# Patient Record
Sex: Male | Born: 1935 | Race: White | Hispanic: No | Marital: Married | Smoking: Former smoker
Health system: Southern US, Community
[De-identification: ages and names within clinical notes are randomized; demographics above are authoritative.]

## PROBLEM LIST (undated history)

## (undated) DIAGNOSIS — R7889 Finding of other specified substances, not normally found in blood: Secondary | ICD-10-CM

## (undated) DIAGNOSIS — R011 Cardiac murmur, unspecified: Secondary | ICD-10-CM

## (undated) DIAGNOSIS — I739 Peripheral vascular disease, unspecified: Secondary | ICD-10-CM

## (undated) DIAGNOSIS — I2510 Atherosclerotic heart disease of native coronary artery without angina pectoris: Secondary | ICD-10-CM

## (undated) DIAGNOSIS — R42 Dizziness and giddiness: Secondary | ICD-10-CM

## (undated) DIAGNOSIS — I10 Essential (primary) hypertension: Secondary | ICD-10-CM

## (undated) DIAGNOSIS — E119 Type 2 diabetes mellitus without complications: Secondary | ICD-10-CM

## (undated) DIAGNOSIS — N289 Disorder of kidney and ureter, unspecified: Secondary | ICD-10-CM

## (undated) HISTORY — DX: Essential (primary) hypertension: I10

## (undated) HISTORY — DX: Atherosclerotic heart disease of native coronary artery without angina pectoris: I25.10

## (undated) HISTORY — DX: Dizziness and giddiness: R42

## (undated) HISTORY — DX: Type 2 diabetes mellitus without complications: E11.9

## (undated) HISTORY — DX: Peripheral vascular disease, unspecified: I73.9

## (undated) HISTORY — PX: CAROTID ENDARTERECTOMY: SUR193

## (undated) HISTORY — DX: Finding of other specified substances, not normally found in blood: R78.89

## (undated) HISTORY — PX: CARDIAC PACEMAKER PLACEMENT: SHX583

## (undated) HISTORY — DX: Disorder of kidney and ureter, unspecified: N28.9

## (undated) HISTORY — DX: Cardiac murmur, unspecified: R01.1

## (undated) HISTORY — PX: HERNIA REPAIR: SHX51

---

## 2004-09-18 ENCOUNTER — Ambulatory Visit (HOSPITAL_COMMUNITY): Admission: RE | Admit: 2004-09-18 | Discharge: 2004-09-18 | Payer: Self-pay | Admitting: Cardiology

## 2005-11-04 ENCOUNTER — Ambulatory Visit (HOSPITAL_COMMUNITY): Admission: RE | Admit: 2005-11-04 | Discharge: 2005-11-04 | Payer: Self-pay | Admitting: Cardiology

## 2005-11-04 ENCOUNTER — Encounter: Payer: Self-pay | Admitting: Vascular Surgery

## 2006-04-30 ENCOUNTER — Ambulatory Visit (HOSPITAL_COMMUNITY): Admission: RE | Admit: 2006-04-30 | Discharge: 2006-04-30 | Payer: Self-pay | Admitting: Cardiology

## 2006-04-30 ENCOUNTER — Encounter: Payer: Self-pay | Admitting: Vascular Surgery

## 2007-05-04 ENCOUNTER — Ambulatory Visit (HOSPITAL_COMMUNITY): Admission: RE | Admit: 2007-05-04 | Discharge: 2007-05-04 | Payer: Self-pay | Admitting: Cardiology

## 2007-05-04 ENCOUNTER — Ambulatory Visit: Payer: Self-pay | Admitting: Vascular Surgery

## 2007-05-04 ENCOUNTER — Encounter (INDEPENDENT_AMBULATORY_CARE_PROVIDER_SITE_OTHER): Payer: Self-pay | Admitting: Cardiology

## 2013-08-14 ENCOUNTER — Other Ambulatory Visit: Payer: Self-pay | Admitting: Cardiology

## 2013-09-11 DIAGNOSIS — R7889 Finding of other specified substances, not normally found in blood: Secondary | ICD-10-CM | POA: Insufficient documentation

## 2013-09-11 DIAGNOSIS — R42 Dizziness and giddiness: Secondary | ICD-10-CM | POA: Insufficient documentation

## 2013-09-11 DIAGNOSIS — N289 Disorder of kidney and ureter, unspecified: Secondary | ICD-10-CM | POA: Insufficient documentation

## 2013-09-11 DIAGNOSIS — I739 Peripheral vascular disease, unspecified: Secondary | ICD-10-CM | POA: Insufficient documentation

## 2013-09-11 DIAGNOSIS — E119 Type 2 diabetes mellitus without complications: Secondary | ICD-10-CM | POA: Insufficient documentation

## 2013-09-11 DIAGNOSIS — I10 Essential (primary) hypertension: Secondary | ICD-10-CM | POA: Insufficient documentation

## 2013-09-11 DIAGNOSIS — R079 Chest pain, unspecified: Secondary | ICD-10-CM | POA: Insufficient documentation

## 2013-09-11 DIAGNOSIS — I2510 Atherosclerotic heart disease of native coronary artery without angina pectoris: Secondary | ICD-10-CM | POA: Insufficient documentation

## 2013-09-11 DIAGNOSIS — R011 Cardiac murmur, unspecified: Secondary | ICD-10-CM | POA: Insufficient documentation

## 2013-10-13 ENCOUNTER — Ambulatory Visit (INDEPENDENT_AMBULATORY_CARE_PROVIDER_SITE_OTHER): Payer: Medicare Other | Admitting: Cardiovascular Disease

## 2013-10-13 ENCOUNTER — Encounter (HOSPITAL_COMMUNITY): Payer: Self-pay | Admitting: *Deleted

## 2013-10-13 ENCOUNTER — Encounter: Payer: Self-pay | Admitting: Cardiovascular Disease

## 2013-10-13 VITALS — BP 150/62 | HR 70 | Ht 67.0 in | Wt 137.6 lb

## 2013-10-13 DIAGNOSIS — E782 Mixed hyperlipidemia: Secondary | ICD-10-CM

## 2013-10-13 DIAGNOSIS — E119 Type 2 diabetes mellitus without complications: Secondary | ICD-10-CM

## 2013-10-13 DIAGNOSIS — R5383 Other fatigue: Secondary | ICD-10-CM

## 2013-10-13 DIAGNOSIS — I359 Nonrheumatic aortic valve disorder, unspecified: Secondary | ICD-10-CM

## 2013-10-13 DIAGNOSIS — R5381 Other malaise: Secondary | ICD-10-CM

## 2013-10-13 DIAGNOSIS — I4819 Other persistent atrial fibrillation: Secondary | ICD-10-CM

## 2013-10-13 DIAGNOSIS — I2510 Atherosclerotic heart disease of native coronary artery without angina pectoris: Secondary | ICD-10-CM

## 2013-10-13 DIAGNOSIS — I739 Peripheral vascular disease, unspecified: Secondary | ICD-10-CM

## 2013-10-13 DIAGNOSIS — I4891 Unspecified atrial fibrillation: Secondary | ICD-10-CM

## 2013-10-13 DIAGNOSIS — Z9889 Other specified postprocedural states: Secondary | ICD-10-CM

## 2013-10-13 DIAGNOSIS — Z79899 Other long term (current) drug therapy: Secondary | ICD-10-CM

## 2013-10-13 DIAGNOSIS — E1165 Type 2 diabetes mellitus with hyperglycemia: Secondary | ICD-10-CM

## 2013-10-13 DIAGNOSIS — I350 Nonrheumatic aortic (valve) stenosis: Secondary | ICD-10-CM

## 2013-10-13 MED ORDER — FUROSEMIDE 20 MG PO TABS: 20.0000 mg | ORAL_TABLET | Freq: Every day | ORAL | Status: DC

## 2013-10-13 NOTE — Patient Instructions (Addendum)
Your physician has requested that you have a lower or upper extremity venous duplex. This test is an ultrasound of the veins in the legs or arms. It looks at venous blood flow that carries blood from the heart to the legs or arms. Allow one hour for a Lower Venous exam. Allow thirty minutes for an Upper Venous exam. There are no restrictions or special instructions.  Your physician has requested that you have a lower or upper extremity arterial duplex. This test is an ultrasound of the arteries in the legs or arms. It looks at arterial blood flow in the legs and arms. Allow one hour for Lower and Upper Arterial scans. There are no restrictions or special instructions  Your physician has recommended making the following medication changes:  START Furosemide 20 mg - take 1 tablet daily  Your physician recommends that you schedule a follow-up appointment with Dr Royann Shiversroitoru in July.

## 2013-10-19 ENCOUNTER — Encounter: Payer: Self-pay | Admitting: Interventional Cardiology

## 2013-10-19 ENCOUNTER — Encounter: Payer: Self-pay | Admitting: *Deleted

## 2013-10-20 ENCOUNTER — Ambulatory Visit (HOSPITAL_COMMUNITY)
Admission: RE | Admit: 2013-10-20 | Discharge: 2013-10-20 | Disposition: A | Discharge status: Home / Self Care | Payer: Medicare Other | Source: Ambulatory Visit | Attending: Cardiology | Admitting: Cardiology

## 2013-10-20 ENCOUNTER — Ambulatory Visit (HOSPITAL_COMMUNITY)
Admission: RE | Admit: 2013-10-20 | Discharge: 2013-10-20 | Disposition: A | Discharge status: Home / Self Care | Payer: Medicare Other | Source: Ambulatory Visit | Attending: Cardiovascular Disease | Admitting: Cardiovascular Disease

## 2013-10-20 DIAGNOSIS — M7989 Other specified soft tissue disorders: Secondary | ICD-10-CM | POA: Insufficient documentation

## 2013-10-20 DIAGNOSIS — I739 Peripheral vascular disease, unspecified: Secondary | ICD-10-CM | POA: Insufficient documentation

## 2013-10-20 NOTE — Progress Notes (Signed)
Arterial Lower Ext. Duplex Completed. Rita Sturdivant, BS, RDMS, RVT  

## 2013-10-20 NOTE — Progress Notes (Signed)
Venous Duplex Lower Ext. Completed. Rita Sturdivant, BS, RDMS, RVT  

## 2013-10-25 DIAGNOSIS — I4891 Unspecified atrial fibrillation: Secondary | ICD-10-CM | POA: Insufficient documentation

## 2013-10-25 DIAGNOSIS — I350 Nonrheumatic aortic (valve) stenosis: Secondary | ICD-10-CM | POA: Insufficient documentation

## 2013-10-25 NOTE — Progress Notes (Signed)
Patient ID: Charles Ramsey, male   DOB: 12-29-1917, 80 y.o.   MRN: [id number]     PATIENT PROFILE: Charles Ramsey is a 80 y.o. male  was referred for establishment of cardiology care.  He had lived here for some time and then moved to [place] and recently has returned to the [place] area after his wife's death.   HPI:  Charles Ramsey is a 80 y.o. male has a complicated medical history that I do not have all the specifics.  However, he does have a past medical history notable for hypertension, diabetes mellitus, hyperlipidemia, a 2 fibrillation, and he status post cardiac pacemaker implantation, carotid endarterectomy.  He also tells me he has undergone numerous stenting procedures to his heart, kidneys, and lower extremities while he was living in [place].  His initial pacemaker was implanted in 2001.  His last upgrade was in May 2014 in [place], [place].  In May 2015 21 to his niece's graduation at [place].  He developed significant leg cramps later that night and also had some chest discomfort.  He apparently underwent a comprehensive evaluation at [facility] emergency room where he was For less than one day.  We have received some of those records.  An echo Doppler study was done, which showed an ejection fraction of 60-65% with mild LVH and normal wall motion.  His underlying rhythm was atrial fibrillation.  He had biatrial enlargement.  There was moderate pulmonary hypertension with an estimated R. the systolic pressure at 45-50.  Moderate sclerosis of his aortic valve with a peak gradient of 30 mean gradient of 14 and a valve area 1.3, compatible with probable moderate aortic stenosis.  A CT of his brain was done, which showed some small lacunar infarct in the right caudate head, which was felt to be old.  His diabetes was felt to be poorly controlled and hemoglobin A1c was 11.  Because of his pacemaker.  He was advised to have cardiology followup who presents to the office  today.  Presently, he denies recurrent chest pain.  He denies PND, or orthopnea.  He has a history of early 7 skin cancers on his face.  He status post right eye operation for his lower lid.  He also remotely had exploratory laparotomy and had a perforated appendix in 2001.  He sees Dr. Benedetto Goad for his primary care.  He also established with [facility].  Past Medical History  Diagnosis Date  . Peripheral blood vessel disorder   . BP (high blood pressure)   . Arteriosclerosis of coronary artery   . Impaired renal function   . Dizziness   . Diabetes mellitus, type 2   . Chest pain   . Cardiac murmur   . Abnormal blood level of lithium     Past Surgical History  Procedure Laterality Date  . Cardiac pacemaker placement    . Hernia repair    . Carotid endarterectomy      Allergies  Allergen Reactions  . Contrast Media [Iodinated Diagnostic Agents] Shortness Of Breath    Current Outpatient Prescriptions  Medication Sig Dispense Refill  . aspirin 81 MG tablet Take 81 mg by mouth daily.      . Calcium Carbonate-Vit D-Min (CALCIUM 1200 PO) Take 1 tablet by mouth daily.      . Cholecalciferol (VITAMIN D-3 PO) Take 1,000 Units by mouth daily.      . clopidogrel (PLAVIX) 75 MG tablet Take 75 mg by mouth daily with breakfast.      .  digoxin (LANOXIN) 0.125 MG tablet Take 0.125 mg by mouth daily.      Marland Kitchen. glimepiride (AMARYL) 2 MG tablet Take 2 mg by mouth 2 (two) times daily.       . metoprolol (LOPRESSOR) 50 MG tablet Take 50 mg by mouth 2 (two) times daily.      . simvastatin (ZOCOR) 20 MG tablet Take 20 mg by mouth daily.      Marland Kitchen. spironolactone (ALDACTONE) 25 MG tablet Take 25 mg by mouth daily.      . calcium carbonate (OS-CAL) 600 MG TABS tablet Take 600 mg by mouth daily.      . diphenhydrAMINE (BENADRYL) 25 mg capsule Take 25 mg by mouth as needed.      . furosemide (LASIX) 20 MG tablet Take 1 tablet (20 mg total) by mouth daily.  90 tablet  3   No current  facility-administered medications for this visit.    Socially, he is widowed.  He and his wife.  [place] [address] from [place] until 2010.  After his wife died he moved back to [place] for 5 years and this past November with back to the [place] area.  He is a former smoker.  No family history on file.  ROS General: Negative; No fevers, chills, or night sweats HEENT: He has hearing aids.  He required right eye surgery.  sinus congestion, difficulty swallowing Pulmonary: Negative; No cough, wheezing, shortness of breath, hemoptysis Cardiovascular:  See HPI; No chest pain, presyncope, syncope, palpitations,  He states he has stents in his heart, kidneys, and lower extremities GI: Negative; No nausea, vomiting, diarrhea, or abdominal pain GU: Negative; No dysuria, hematuria, or difficulty voiding Musculoskeletal: Negative; no myalgias, joint pain, or weakness Hematologic/Oncologic: Negative; no easy bruising, bleeding Endocrine: Positive for diabetes mellitus; no heat/cold intolerance; Neuro: Negative; no changes in balance, headaches Skin: Negative; No rashes or skin lesions Psychiatric: Negative; No behavioral problems, depression Sleep: Negative; No daytime sleepiness, hypersomnolence, bruxism, restless legs, hypnogagnic hallucinations Other comprehensive 14 point system review is negative   Physical Exam BP 150/62  Pulse 70  Ht 5\' 7"  (1.702 m)  Wt 137 lb 9.6 oz (62.415 kg)  BMI 21.55 kg/m2 General: Alert, oriented, no distress.  Skin: Sun damaged skin with actinic keratoses and seborrheic keratoses; ecchymoses of his arms HEENT: Normocephalic, atraumatic. Pupils equal round and reactive to light; sclera anicteric; extraocular muscles intact; Fundi not well visualized Nose without nasal septal hypertrophy Mouth/Parynx benign; Mallinpatti scale 3 Neck: No JVD, bilateral carotid bruits, left l>right;  Lungs: clear to ausculatation and  percussion; no wheezing or rales Chest wall: Large pectus excavatum Heart: PMI not displaced, RRR, s1 s2 normal, 2/6 systolic murmur, no diastolic murmur, no rubs, gallops, thrills, or heaves Abdomen: soft, nontender; no hepatosplenomehaly, BS+; abdominal aorta nontender and not dilated by palpation. Back: no CVA tenderness Pulses 2+ Musculoskeletal: full range of motion, normal strength, no joint deformities Extremities: Legs with mild erythema/stasis changes, but dry.  1+ edema.  no clubbing cyanosis  Homan's sign negative  Neurologic: grossly nonfocal; Cranial nerves grossly wnl Psychologic: Normal mood and affect   ECG (independently read by me): Underlying a 2 fibrillation with ventricular paced rhythm with 100% capture.  LABS:  BMET No results found for this basename: na, k, cl, co2, glucose, bun, creatinine, calcium, gfrnonaa, gfraa     Hepatic Function Panel  No results found for this basename: prot, albumin, ast, alt, alkphos, bilitot, bilidir, ibili  CBC No results found for this basename: wbc, rbc, hgb, hct, plt, mcv, mch, mchc, rdw, neutrabs, lymphsabs, monoabs, eosabs, basosabs     BNP No results found for this basename: probnp    Lipid Panel  No results found for this basename: chol, trig, hdl, cholhdl, vldl, ldlcalc      RADIOLOGY: No results found.   ASSESSMENT AND PLAN: Mr. Charles GlimpseMarvin Wolpert has a complex medical history and apparently has undergone prior cardiac stents, stents in his kidney arteries, as well as in his lower extremities.  He has a history of permanent atrial fibrillation and is status post permanent pacemaker implantation initially in 2001 with his most recent upgrade in [place] in May 2014.  I do not have the specifics of what type of pacemaker.  He has.  He did not have a card with him today.  We will need to obtain these results from [place].  I'll also ask that he be enrolled in our maker clinic with potential for telephonic  monitoring and also he establish pacemaker followup care with Dr. Rubie Maidroituro.  I am scheduling him for lower extremity arterial and venous Doppler studies.  He'll also undergo followup blood work including thyroid function studies, CMP, CBC.  He recently was found to have significant elevation of hemoglobin A1c and this is being monitored by Dr. Benedetto GoadFred Wilson.  He will followup with Dr.Croitoru for pacemaker evaluation in July. I have recommended he start furosemide 20 mg daily in light of his leg edema.   Lennette Biharihomas A. Kelly, MD, [facility] 10/25/2013 9:19 PM

## 2013-11-10 ENCOUNTER — Telehealth: Payer: Self-pay | Admitting: *Deleted

## 2013-11-10 DIAGNOSIS — I739 Peripheral vascular disease, unspecified: Secondary | ICD-10-CM

## 2013-11-10 NOTE — Telephone Encounter (Signed)
Message copied by Lindell SparELKINS, JENNA M on Thu Nov 10, 2013  1:02 PM ------      Message from: Nicki GuadalajaraKELLY, THOMAS A      Created: Fri Nov 04, 2013 10:23 AM       Try to obtain old results from [place]; if symptomatic consider PV eval with JB ------

## 2013-11-10 NOTE — Telephone Encounter (Signed)
Patient was notified of LEA results. He is NOT symptomatic. Repeat doppler for 6 months ordered. [facility]. He is due in July for pacer check/to establish with Dr. Salena Saner to monitor pacemaker and I asked that he sign release form at this visit to get old records. Patient agrees with plan.   Repeat doppler ordered.

## 2013-11-17 ENCOUNTER — Telehealth: Payer: Self-pay | Admitting: Cardiovascular Disease

## 2013-11-18 NOTE — Telephone Encounter (Signed)
Closed encounter °

## 2013-11-21 ENCOUNTER — Ambulatory Visit (INDEPENDENT_AMBULATORY_CARE_PROVIDER_SITE_OTHER): Payer: Medicare Other | Admitting: *Deleted

## 2013-11-21 DIAGNOSIS — I4891 Unspecified atrial fibrillation: Secondary | ICD-10-CM

## 2013-11-21 LAB — MDC_IDC_ENUM_SESS_TYPE_INCLINIC
Battery Voltage: 3.01 V
Brady Statistic RA Percent Paced: 1 %
Brady Statistic RV Percent Paced: 85 %
Lead Channel Impedance Value: 510 Ohm
Lead Channel Impedance Value: 600 Ohm
Lead Channel Pacing Threshold Pulse Width: 0.4 ms
Lead Channel Sensing Intrinsic Amplitude: 1 mV
Lead Channel Setting Pacing Amplitude: 2.5 V
Lead Channel Setting Pacing Amplitude: 2.5 V
Lead Channel Setting Pacing Pulse Width: 0.4 ms
MDC IDC MSMT BATTERY REMAINING LONGEVITY: 117 mo
MDC IDC MSMT LEADCHNL RV PACING THRESHOLD AMPLITUDE: 0.75 V
MDC IDC MSMT LEADCHNL RV SENSING INTR AMPL: 12 mV
MDC IDC PG SERIAL: 7461903

## 2013-11-21 NOTE — Progress Notes (Signed)
Pacemaker check in clinic. Normal device function. Thresholds, sensing, impedances consistent with previous measurements. Device programmed to maximize longevity. A-fib, + plavix.  3 high ventricular rates noted. Device programmed at appropriate safety margins. Histogram distribution appropriate for patient activity level. Device programmed to optimize intrinsic conduction. Estimated longevity 9.9 years.  Patient education completed.  ROV in August with Dr. Royann Shiversroitoru.

## 2013-11-30 ENCOUNTER — Ambulatory Visit: Payer: Medicare Other | Admitting: Cardiology

## 2013-12-02 NOTE — Addendum Note (Signed)
Addended by: Abram SanderSIGMON, Leelah Hanna J on: 12/02/2013 01:19 PM   Modules accepted: Orders

## 2013-12-21 ENCOUNTER — Encounter: Payer: Medicare Other | Admitting: Cardiovascular Disease

## 2014-07-25 ENCOUNTER — Inpatient Hospital Stay (HOSPITAL_COMMUNITY): Payer: Medicare Other

## 2014-07-25 ENCOUNTER — Emergency Department (HOSPITAL_COMMUNITY): Payer: Medicare Other

## 2014-07-25 ENCOUNTER — Inpatient Hospital Stay (HOSPITAL_COMMUNITY)
Admission: EM | Admit: 2014-07-25 | Discharge: 2014-07-28 | DRG: 871 | Disposition: A | Discharge status: Skilled Nursing Facility | Payer: Medicare Other | Attending: Internal Medicine | Admitting: Internal Medicine

## 2014-07-25 ENCOUNTER — Encounter (HOSPITAL_COMMUNITY): Payer: Self-pay | Admitting: Family Medicine

## 2014-07-25 DIAGNOSIS — I482 Chronic atrial fibrillation, unspecified: Secondary | ICD-10-CM | POA: Insufficient documentation

## 2014-07-25 DIAGNOSIS — N189 Chronic kidney disease, unspecified: Secondary | ICD-10-CM | POA: Insufficient documentation

## 2014-07-25 DIAGNOSIS — R42 Dizziness and giddiness: Secondary | ICD-10-CM | POA: Diagnosis not present

## 2014-07-25 DIAGNOSIS — Z87891 Personal history of nicotine dependence: Secondary | ICD-10-CM | POA: Diagnosis not present

## 2014-07-25 DIAGNOSIS — N289 Disorder of kidney and ureter, unspecified: Secondary | ICD-10-CM

## 2014-07-25 DIAGNOSIS — E876 Hypokalemia: Secondary | ICD-10-CM | POA: Diagnosis present

## 2014-07-25 DIAGNOSIS — Z6820 Body mass index (BMI) 20.0-20.9, adult: Secondary | ICD-10-CM

## 2014-07-25 DIAGNOSIS — E874 Mixed disorder of acid-base balance: Secondary | ICD-10-CM | POA: Diagnosis present

## 2014-07-25 DIAGNOSIS — A419 Sepsis, unspecified organism: Secondary | ICD-10-CM | POA: Diagnosis present

## 2014-07-25 DIAGNOSIS — I350 Nonrheumatic aortic (valve) stenosis: Secondary | ICD-10-CM | POA: Diagnosis present

## 2014-07-25 DIAGNOSIS — E872 Acidosis, unspecified: Secondary | ICD-10-CM | POA: Insufficient documentation

## 2014-07-25 DIAGNOSIS — Z7902 Long term (current) use of antithrombotics/antiplatelets: Secondary | ICD-10-CM | POA: Diagnosis not present

## 2014-07-25 DIAGNOSIS — I739 Peripheral vascular disease, unspecified: Secondary | ICD-10-CM | POA: Diagnosis present

## 2014-07-25 DIAGNOSIS — J449 Chronic obstructive pulmonary disease, unspecified: Secondary | ICD-10-CM | POA: Diagnosis present

## 2014-07-25 DIAGNOSIS — I441 Atrioventricular block, second degree: Secondary | ICD-10-CM | POA: Insufficient documentation

## 2014-07-25 DIAGNOSIS — R410 Disorientation, unspecified: Secondary | ICD-10-CM

## 2014-07-25 DIAGNOSIS — I4891 Unspecified atrial fibrillation: Secondary | ICD-10-CM | POA: Diagnosis present

## 2014-07-25 DIAGNOSIS — I10 Essential (primary) hypertension: Secondary | ICD-10-CM | POA: Diagnosis present

## 2014-07-25 DIAGNOSIS — IMO0002 Reserved for concepts with insufficient information to code with codable children: Secondary | ICD-10-CM | POA: Insufficient documentation

## 2014-07-25 DIAGNOSIS — Z950 Presence of cardiac pacemaker: Secondary | ICD-10-CM | POA: Diagnosis not present

## 2014-07-25 DIAGNOSIS — G9340 Encephalopathy, unspecified: Secondary | ICD-10-CM | POA: Diagnosis not present

## 2014-07-25 DIAGNOSIS — E785 Hyperlipidemia, unspecified: Secondary | ICD-10-CM | POA: Insufficient documentation

## 2014-07-25 DIAGNOSIS — R778 Other specified abnormalities of plasma proteins: Secondary | ICD-10-CM | POA: Insufficient documentation

## 2014-07-25 DIAGNOSIS — I129 Hypertensive chronic kidney disease with stage 1 through stage 4 chronic kidney disease, or unspecified chronic kidney disease: Secondary | ICD-10-CM | POA: Diagnosis present

## 2014-07-25 DIAGNOSIS — I480 Paroxysmal atrial fibrillation: Secondary | ICD-10-CM | POA: Diagnosis not present

## 2014-07-25 DIAGNOSIS — G9349 Other encephalopathy: Secondary | ICD-10-CM | POA: Diagnosis present

## 2014-07-25 DIAGNOSIS — E1165 Type 2 diabetes mellitus with hyperglycemia: Secondary | ICD-10-CM | POA: Diagnosis present

## 2014-07-25 DIAGNOSIS — Z7982 Long term (current) use of aspirin: Secondary | ICD-10-CM

## 2014-07-25 DIAGNOSIS — R109 Unspecified abdominal pain: Secondary | ICD-10-CM | POA: Diagnosis not present

## 2014-07-25 DIAGNOSIS — E440 Moderate protein-calorie malnutrition: Secondary | ICD-10-CM | POA: Insufficient documentation

## 2014-07-25 DIAGNOSIS — N179 Acute kidney failure, unspecified: Secondary | ICD-10-CM | POA: Diagnosis present

## 2014-07-25 DIAGNOSIS — R0602 Shortness of breath: Secondary | ICD-10-CM

## 2014-07-25 DIAGNOSIS — E873 Alkalosis: Secondary | ICD-10-CM | POA: Diagnosis present

## 2014-07-25 DIAGNOSIS — I2510 Atherosclerotic heart disease of native coronary artery without angina pectoris: Secondary | ICD-10-CM | POA: Diagnosis present

## 2014-07-25 DIAGNOSIS — E119 Type 2 diabetes mellitus without complications: Secondary | ICD-10-CM

## 2014-07-25 DIAGNOSIS — R7989 Other specified abnormal findings of blood chemistry: Secondary | ICD-10-CM | POA: Diagnosis not present

## 2014-07-25 DIAGNOSIS — R55 Syncope and collapse: Secondary | ICD-10-CM | POA: Diagnosis not present

## 2014-07-25 DIAGNOSIS — R079 Chest pain, unspecified: Secondary | ICD-10-CM

## 2014-07-25 LAB — I-STAT ARTERIAL BLOOD GAS, ED
ACID-BASE EXCESS: 12 mmol/L — AB (ref 0.0–2.0)
ACID-BASE EXCESS: 6 mmol/L — AB (ref 0.0–2.0)
BICARBONATE: 28.8 meq/L — AB (ref 20.0–24.0)
Bicarbonate: 21.3 mEq/L (ref 20.0–24.0)
O2 Saturation: 100 %
O2 Saturation: 100 %
PH ART: 7.828 — AB (ref 7.350–7.450)
Patient temperature: 97
Patient temperature: 98.6
TCO2: 29 mmol/L (ref 0–100)
TCO2: 22 mmol/L (ref 0–100)
pCO2 arterial: 17.3 mmHg — CL (ref 35.0–45.0)
pCO2 arterial: 14 mmHg — CL (ref 35.0–45.0)
pH, Arterial: 7.791 (ref 7.350–7.450)
pO2, Arterial: 358 mmHg — ABNORMAL HIGH (ref 80.0–100.0)
pO2, Arterial: 133 mmHg — ABNORMAL HIGH (ref 80.0–100.0)

## 2014-07-25 LAB — URINALYSIS W MICROSCOPIC (NOT AT ARMC)
Bilirubin Urine: NEGATIVE
GLUCOSE, UA: NEGATIVE mg/dL
Hgb urine dipstick: NEGATIVE
KETONES UR: 15 mg/dL — AB
Leukocytes, UA: NEGATIVE
Nitrite: NEGATIVE
Protein, ur: NEGATIVE mg/dL
SPECIFIC GRAVITY, URINE: 1.008 (ref 1.005–1.030)
Urobilinogen, UA: 1 mg/dL (ref 0.0–1.0)
pH: 6.5 (ref 5.0–8.0)

## 2014-07-25 LAB — SALICYLATE LEVEL

## 2014-07-25 LAB — BASIC METABOLIC PANEL
ANION GAP: 16 — AB (ref 5–15)
BUN: 48 mg/dL — ABNORMAL HIGH (ref 6–23)
CALCIUM: 9.2 mg/dL (ref 8.4–10.5)
CO2: 28 mmol/L (ref 19–32)
Chloride: 93 mmol/L — ABNORMAL LOW (ref 96–112)
Creatinine, Ser: 2.88 mg/dL — ABNORMAL HIGH (ref 0.50–1.35)
GFR calc Af Amer: 20 mL/min — ABNORMAL LOW (ref >90)
GFR, EST NON AFRICAN AMERICAN: 17 mL/min — AB (ref >90)
Glucose, Bld: 178 mg/dL — ABNORMAL HIGH (ref 70–99)
Potassium: 4 mmol/L (ref 3.5–5.1)
Sodium: 137 mmol/L (ref 135–145)

## 2014-07-25 LAB — CBC
HCT: 44.6 % (ref 39.0–52.0)
Hemoglobin: 15 g/dL (ref 13.0–17.0)
MCH: 32.1 pg (ref 26.0–34.0)
MCHC: 33.6 g/dL (ref 30.0–36.0)
MCV: 95.3 fL (ref 78.0–100.0)
Platelets: 213 10*3/uL (ref 150–400)
RBC: 4.68 MIL/uL (ref 4.22–5.81)
RDW: 13.2 % (ref 11.5–15.5)
WBC: 9.7 10*3/uL (ref 4.0–10.5)

## 2014-07-25 LAB — ACETAMINOPHEN LEVEL: Acetaminophen (Tylenol), Serum: <10 ug/mL — ABNORMAL LOW (ref 10–30)

## 2014-07-25 LAB — I-STAT CG4 LACTIC ACID, ED: LACTIC ACID, VENOUS: 4.26 mmol/L — AB (ref 0.5–2.0)

## 2014-07-25 LAB — I-STAT TROPONIN, ED: Troponin i, poc: 0.05 ng/mL (ref 0.00–0.08)

## 2014-07-25 LAB — DIGOXIN LEVEL: Digoxin Level: 1.5 ng/mL (ref 0.8–2.0)

## 2014-07-25 MED ORDER — VITAMIN D 1000 UNITS PO TABS
1000.0000 [IU] | ORAL_TABLET | Freq: Every day | ORAL | Status: DC
  Administered 2014-07-27: 1000 [IU] via ORAL
  Filled 2014-07-25 (×3): qty 1

## 2014-07-25 MED ORDER — HEPARIN SODIUM (PORCINE) 5000 UNIT/ML IJ SOLN
5000.0000 [IU] | Freq: Three times a day (TID) | INTRAMUSCULAR | Status: DC
  Administered 2014-07-26 – 2014-07-28 (×7): 5000 [IU] via SUBCUTANEOUS
  Filled 2014-07-25 (×9): qty 1

## 2014-07-25 MED ORDER — SIMVASTATIN 20 MG PO TABS
20.0000 mg | ORAL_TABLET | Freq: Every day | ORAL | Status: DC
Start: 2014-07-26 — End: 2014-07-29
  Administered 2014-07-27: 20 mg via ORAL
  Filled 2014-07-25 (×3): qty 1

## 2014-07-25 MED ORDER — VANCOMYCIN HCL 10 G IV SOLR
1500.0000 mg | Freq: Once | INTRAVENOUS | Status: AC
  Administered 2014-07-25: 1500 mg via INTRAVENOUS
  Filled 2014-07-25: qty 1500

## 2014-07-25 MED ORDER — SODIUM CHLORIDE 0.9 % IV SOLN
INTRAVENOUS | Status: DC
  Administered 2014-07-26 (×2): via INTRAVENOUS

## 2014-07-25 MED ORDER — SODIUM CHLORIDE 0.9 % IJ SOLN: 3.0000 mL | Freq: Two times a day (BID) | INTRAMUSCULAR | Status: DC

## 2014-07-25 MED ORDER — SODIUM CHLORIDE 0.9 % IV BOLUS (SEPSIS)
500.0000 mL | Freq: Once | INTRAVENOUS | Status: AC
  Administered 2014-07-25: 500 mL via INTRAVENOUS

## 2014-07-25 MED ORDER — CLOPIDOGREL BISULFATE 75 MG PO TABS
75.0000 mg | ORAL_TABLET | Freq: Every day | ORAL | Status: DC
  Administered 2014-07-26 – 2014-07-27 (×2): 75 mg via ORAL
  Filled 2014-07-25 (×4): qty 1

## 2014-07-25 MED ORDER — INSULIN ASPART 100 UNIT/ML ~~LOC~~ SOLN
0.0000 [IU] | Freq: Three times a day (TID) | SUBCUTANEOUS | Status: DC
Start: 2014-07-26 — End: 2014-07-29
  Filled 2014-07-25: qty 1

## 2014-07-25 MED ORDER — TECHNETIUM TO 99M ALBUMIN AGGREGATED
6.0000 | Freq: Once | INTRAVENOUS | Status: AC | PRN
  Administered 2014-07-25: 6 via INTRAVENOUS

## 2014-07-25 MED ORDER — VANCOMYCIN HCL 10 G IV SOLR
1500.0000 mg | Freq: Two times a day (BID) | INTRAVENOUS | Status: DC
  Filled 2014-07-25: qty 1500

## 2014-07-25 MED ORDER — SODIUM CHLORIDE 0.9 % IJ SOLN
3.0000 mL | Freq: Two times a day (BID) | INTRAMUSCULAR | Status: DC
  Administered 2014-07-26 – 2014-07-27 (×2): 3 mL via INTRAVENOUS

## 2014-07-25 MED ORDER — SODIUM CHLORIDE 0.9 % IV BOLUS (SEPSIS)
1500.0000 mL | Freq: Once | INTRAVENOUS | Status: AC
  Administered 2014-07-25: 1000 mL via INTRAVENOUS

## 2014-07-25 MED ORDER — MORPHINE SULFATE 2 MG/ML IJ SOLN: 1.0000 mg | INTRAMUSCULAR | Status: DC | PRN

## 2014-07-25 MED ORDER — TECHNETIUM TC 99M DIETHYLENETRIAME-PENTAACETIC ACID: 40.0000 | Freq: Once | INTRAVENOUS | Status: AC | PRN

## 2014-07-25 MED ORDER — SODIUM CHLORIDE 0.9 % IJ SOLN: 3.0000 mL | INTRAMUSCULAR | Status: DC | PRN

## 2014-07-25 MED ORDER — CALCIUM CARBONATE 1250 (500 CA) MG PO TABS
1.0000 | ORAL_TABLET | Freq: Every day | ORAL | Status: DC
  Administered 2014-07-27: 500 mg via ORAL
  Filled 2014-07-25 (×4): qty 1

## 2014-07-25 MED ORDER — CALCIUM CARBONATE-VITAMIN D 500-200 MG-UNIT PO TABS
1.0000 | ORAL_TABLET | Freq: Every day | ORAL | Status: DC
  Filled 2014-07-25 (×4): qty 1

## 2014-07-25 MED ORDER — PIPERACILLIN-TAZOBACTAM 3.375 G IVPB 30 MIN
3.3750 g | Freq: Once | INTRAVENOUS | Status: AC
  Administered 2014-07-25: 3.375 g via INTRAVENOUS
  Filled 2014-07-25: qty 50

## 2014-07-25 MED ORDER — PIPERACILLIN-TAZOBACTAM 3.375 G IVPB 30 MIN: 3.3750 g | Freq: Three times a day (TID) | INTRAVENOUS | Status: DC

## 2014-07-25 MED ORDER — ASPIRIN 81 MG PO CHEW
81.0000 mg | CHEWABLE_TABLET | Freq: Every day | ORAL | Status: DC
Start: 2014-07-26 — End: 2014-07-29
  Administered 2014-07-27: 81 mg via ORAL
  Filled 2014-07-25 (×2): qty 1

## 2014-07-25 MED ORDER — NITROGLYCERIN 0.4 MG SL SUBL: 0.4000 mg | SUBLINGUAL_TABLET | SUBLINGUAL | Status: DC | PRN

## 2014-07-25 MED ORDER — METOPROLOL TARTRATE 50 MG PO TABS
50.0000 mg | ORAL_TABLET | Freq: Two times a day (BID) | ORAL | Status: DC
  Filled 2014-07-25: qty 2
  Filled 2014-07-25: qty 1

## 2014-07-25 MED ORDER — ACETAMINOPHEN 325 MG PO TABS: 650.0000 mg | ORAL_TABLET | Freq: Four times a day (QID) | ORAL | Status: DC | PRN

## 2014-07-25 MED ORDER — VANCOMYCIN HCL IN DEXTROSE 1-5 GM/200ML-% IV SOLN: 1000.0000 mg | INTRAVENOUS | Status: DC

## 2014-07-25 MED ORDER — SODIUM CHLORIDE 0.9 % IV SOLN: 250.0000 mL | INTRAVENOUS | Status: DC | PRN

## 2014-07-25 MED ORDER — ACETAMINOPHEN 650 MG RE SUPP: 650.0000 mg | Freq: Four times a day (QID) | RECTAL | Status: DC | PRN

## 2014-07-25 MED ORDER — DIPHENHYDRAMINE HCL 25 MG PO CAPS: 25.0000 mg | ORAL_CAPSULE | ORAL | Status: DC | PRN

## 2014-07-25 NOTE — H&P (Addendum)
[facility] History and Physical  Charles Ramsey ZOX:[id number] DOB: 1918-01-29 DOA: 07/25/2014  Referring physician: ED physician PCP: Pamelia Hoit, MD  Specialists:   Chief Complaint: dizziness and SOB, syncope, AMS  HPI: Charles Ramsey is a 80 y.o. male with past medical history of hypertension, pacemaker placement, coronary artery disease, peripheral vascular disease, chronic kidney disease (not sure which stage), atrial fibrillation not on anticoagulation, who presents with dizziness, shortness of breath, syncope and AMS.  Patient is from independent living facility and is mildly confused.Hhistory is obtained from ED's report and also partially from patient himself. It seems that patient has been feeling dizzy for several weeks. He is unsure why he is in the ED today.  He states that his home helper was concerned about him and called EMS.  Upon arrival to ED, patient briefly complained of chest pain and then syncopized briefly.  After several seconds, he began responsive back to his baseline. When I saw patient in ED, he denies chest pain and SOB. No cough, fever or chills. He is mildly confused, and was able answer some questions appropriately. He states he has been feeling intermittently short of breath over the past several months, but currently no SOB. He is taking Lasix and spironolactone for possible leg edema per patient. Patient denies abdominal pain, but he has tenderness upon palpation to the left abdomen. She does not have nausea, vomiting or diarrhea.   In ED, chest x-ray showed COPD changes, without infiltration. Urinalysis negative. Troponin negative. WBC 9.7. Intermittently tachycardia, lactate 4.26. Temperature 98.5, oxygen saturation decreased to 84% which improved with nasal cannula oxygen. Because of tachypnea, ABG was done, which showed pH 7.791, PCO2 14, PO2 133, bicarbonate 21.3. VQ scan was done, which is negative for pulmonary embolism. Cre 2.88 (not clear about  baseline, but patient carries a diagnosis of chronic kidney disease). CT head is negative for acute abnormalities. Patient is admitted to inpatient for further evaluation and treatment. Pacemaker was interrogated per ED and was OK, not documented, by verbally communicated by Dr. Karma Ganja.   Review of Systems: As presented in the history of presenting illness, rest negative.  Where does patient live?  Independent living facility Can patient participate in ADLs? liitle.  Allergy:  Allergies  Allergen Reactions  . Contrast Media [Iodinated Diagnostic Agents] Shortness Of Breath    Past Medical History  Diagnosis Date  . Peripheral blood vessel disorder   . BP (high blood pressure)   . Arteriosclerosis of coronary artery   . Impaired renal function   . Dizziness   . Diabetes mellitus, type 2   . Chest pain   . Cardiac murmur   . Abnormal blood level of lithium     Past Surgical History  Procedure Laterality Date  . Cardiac pacemaker placement    . Hernia repair    . Carotid endarterectomy      Social History:  reports that he quit smoking about 53 years ago. He does not have any smokeless tobacco history on file. His alcohol and drug histories are not on file.  Family History: patient does not remember his family history  Prior to Admission medications   Medication Sig Start Date End Date Taking? Authorizing Provider  aspirin 81 MG tablet Take 81 mg by mouth daily.   Yes Historical Provider, MD  calcium carbonate (OS-CAL) 600 MG TABS tablet Take 600 mg by mouth daily.   Yes Historical Provider, MD  Calcium Carbonate-Vit D-Min (CALCIUM 1200 PO) Take 1 tablet by  mouth daily.   Yes Historical Provider, MD  Cholecalciferol (VITAMIN D-3 PO) Take 1,000 Units by mouth daily.   Yes Historical Provider, MD  clopidogrel (PLAVIX) 75 MG tablet Take 75 mg by mouth daily with breakfast.   Yes Historical Provider, MD  digoxin (LANOXIN) 0.125 MG tablet Take 0.125 mg by mouth daily.   Yes  Historical Provider, MD  diphenhydrAMINE (BENADRYL) 25 mg capsule Take 25 mg by mouth as needed.   Yes Historical Provider, MD  furosemide (LASIX) 20 MG tablet Take 1 tablet (20 mg total) by mouth daily. 10/13/13  Yes Lennette Biharihomas A Kelly, MD  glimepiride (AMARYL) 2 MG tablet Take 2 mg by mouth 2 (two) times daily.    Yes Historical Provider, MD  metoprolol (LOPRESSOR) 50 MG tablet Take 50 mg by mouth 2 (two) times daily.   Yes Historical Provider, MD  PRESCRIPTION MEDICATION Take 1 tablet by mouth daily. "Prescription med for diabetes, in addition to glimepiride, maybe metformin"   Yes Historical Provider, MD  simvastatin (ZOCOR) 20 MG tablet Take 20 mg by mouth daily.   Yes Historical Provider, MD  spironolactone (ALDACTONE) 25 MG tablet Take 25 mg by mouth 3 (three) times a week.    Yes Historical Provider, MD    Physical Exam: Filed Vitals:   07/25/14 1915 07/25/14 1945 07/25/14 2000 07/25/14 2030  BP: 103/47 112/64 118/65 110/44  Pulse: 70  70 70  Temp:      TempSrc:      Resp: 26 30 27 13   SpO2: 100%  100% 100%   General: Not in acute distress HEENT:       Eyes: left eye PERRL, EOMI.  Right eye blinded.no scleral icterus       ENT: No discharge from the ears and nose, no pharynx injection, no tonsillar enlargement.        Neck: No JVD, no bruit, no mass felt. Cardiac: S1/S2, RRR, No murmurs, No gallops or rubs Pulm: No rales, wheezing, rhonchi or rubs. Abd: Soft, nondistended, tenderness over left abdomen, no rebound pain, no organomegaly, BS present Ext: No edema bilaterally. 2+DP/PT pulse bilaterally Musculoskeletal: No joint deformities, erythema, or stiffness, ROM full Skin: No rashes.  Neuro: Alert and oriented to place, not to time and person, cranial nerves II-XII grossly intact, muscle strength 5/5 in all extremeties, sensation to light touch intact. Brachial reflex 2+ bilaterally. Knee reflex 1+ bilaterally. Negative Babinski's sign.  Psych: Patient is not psychotic, no  suicidal or hemocidal ideation.  Labs on Admission:  Basic Metabolic Panel:  Recent Labs Lab 07/25/14 1240  NA 137  K 4.0  CL 93*  CO2 28  GLUCOSE 178*  BUN 48*  CREATININE 2.88*  CALCIUM 9.2   Liver Function Tests: No results for input(s): AST, ALT, ALKPHOS, BILITOT, PROT, ALBUMIN in the last 168 hours. No results for input(s): LIPASE, AMYLASE in the last 168 hours. No results for input(s): AMMONIA in the last 168 hours. CBC:  Recent Labs Lab 07/25/14 1240  WBC 9.7  HGB 15.0  HCT 44.6  MCV 95.3  PLT 213   Cardiac Enzymes: No results for input(s): CKTOTAL, CKMB, CKMBINDEX, TROPONINI in the last 168 hours.  BNP (last 3 results) No results for input(s): BNP in the last 8760 hours.  ProBNP (last 3 results) No results for input(s): PROBNP in the last 8760 hours.  CBG: No results for input(s): GLUCAP in the last 168 hours.  Radiological Exams on Admission: Ct Abdomen Pelvis Wo Contrast  07/25/2014  CLINICAL DATA:  Abdominal pain  EXAM: CT ABDOMEN AND PELVIS WITHOUT CONTRAST  TECHNIQUE: Multidetector CT imaging of the abdomen and pelvis was performed following the standard protocol without IV contrast.  COMPARISON:  None.  FINDINGS: Lung bases shows probable chronic mild interstitial prominence. Small granulomas are noted right lower lobe the largest calcified granuloma right base anteriorly measures 7.6 mm.  Sagittal images of the spine shows mild degenerative changes. Diffuse osteopenia. Unenhanced liver shows no biliary ductal dilatation. There is a large calcified gallstone within gallbladder measures 2 cm. Mild thickening of gallbladder wall. No pericholecystic fluid. Further correlation with gallbladder ultrasound could be performed as clinically warranted.  Atherosclerotic calcifications of abdominal aorta bilateral renal artery. Left renal artery stents are noted. Atherosclerotic calcifications of splenic artery. There is focal aneurysmal dilatation of distal  abdominal aorta measures 2.6 by 2.7 cm. Atherosclerotic calcifications of common iliac arteries. Mild bilateral renal atrophy. Nonobstructive punctate calculus in upper pole of the right kidney measures 2 mm.  No hydronephrosis or hydroureter.  No calcified ureteral calculi.  No small bowel obstruction.  No free abdominal air.  No adenopathy.  Moderate stool noted in distal colon. Small amount of pelvic free fluid noted posterior pelvis. Mild enlarged prostate gland with indentation of urinary bladder base. Prostate gland measures 4.2 x 5.7 cm.  No pericecal inflammation.  IMPRESSION: 1. There is right nonobstructive nephrolithiasis. No hydronephrosis or hydroureter. No calcified ureteral calculi. 2. There is a calcified gallstone within gallbladder measures 2 cm. Mild thickening of gallbladder wall without pericholecystic fluid. Further correlation with gallbladder ultrasound could be performed as clinically warranted. 3. Mild focal aneurysmal dilatation of distal abdominal aorta measures 2.7 x 2.6 cm. 4. No small bowel obstruction. 5. There is an enlarged prostate gland with indentation of urinary bladder base.   Electronically Signed   By: Natasha Mead M.D.   On: 07/25/2014 21:57   Dg Chest 2 View  07/25/2014   CLINICAL DATA:  Dizziness for several months. Chest pain. Bradycardia.  EXAM: CHEST  2 VIEW  COMPARISON:  None.  FINDINGS: Cardiomegaly. Hyperinflation. Dual lead pacer with atrial and ventricular leads, grossly unremarkable. Mild bronchitic change. Scattered granulomata. No active infiltrates or failure. No effusion or pneumothorax. Calcified tortuous aorta. Osteopenia.  IMPRESSION: Cardiomegaly.  COPD.  No active disease.   Electronically Signed   By: Davonna Belling M.D.   On: 07/25/2014 13:55   Ct Head Wo Contrast  07/25/2014   CLINICAL DATA:  Dizziness for several months.  Confusion.  EXAM: CT HEAD WITHOUT CONTRAST  TECHNIQUE: Contiguous axial images were obtained from the base of the skull through  the vertex without intravenous contrast.  COMPARISON:  None.  FINDINGS: There is no acute intracranial hemorrhage, acute infarction, or mass lesion. There is diffuse cerebral cortical atrophy with secondary ventricular dilatation consistent with the patient's age. There is minimal periventricular white matter lucency consistent with slight chronic small vessel ischemic disease.  There are few opacified right mastoid air cells. This is not felt to be significant. Osseous structures are otherwise normal.  IMPRESSION: No acute intracranial abnormality. Age related atrophy. Minimal chronic periventricular small vessel disease.   Electronically Signed   By: Francene Boyers M.D.   On: 07/25/2014 18:51   Nm Pulmonary Perf And Vent  07/25/2014   CLINICAL DATA:  81 year old male with bradycardia and shortness of Breath. Initial encounter.  EXAM: NUCLEAR MEDICINE VENTILATION - PERFUSION LUNG SCAN  TECHNIQUE: Ventilation images were obtained in multiple projections using inhaled aerosol  technetium 57 M DTPA. Perfusion images were obtained in multiple projections after intravenous injection of Tc-57m MAA.  RADIOPHARMACEUTICALS:  40.0 mCi Tc-103m DTPA aerosol and 6.0 mCi Tc-32m MAA  COMPARISON:  Chest radiographs 1331 hr today.  FINDINGS: The patient's arms could not be raised for these images.  Ventilation: Mildly heterogeneous ventilation in both lungs, no discrete ventilation defect.  Perfusion: No wedge shaped peripheral perfusion defects to suggest acute pulmonary embolism.  IMPRESSION: Very low probability of acute pulmonary embolus.   Electronically Signed   By: Odessa Fleming M.D.   On: 07/25/2014 16:26    EKG: Independently reviewed. QTc 535, paced ryhthm  Assessment/Plan Principal Problem:   Sepsis Active Problems:   Arteriosclerosis of coronary artery   Dizziness   Diabetes mellitus, type 2   Peripheral blood vessel disorder   Impaired renal function   Aortic valve stenosis   Atrial fibrillation   Syncope    Essential hypertension   Abdominal pain   Acute encephalopathy   Respiratory alkalosis  Sepsis or SIRS: Patient has elevated lactate 4.26, intermittent ventricular tachycardia and soft bp, meet the criteria for sepsis or SIRS thought his tachycardia can be from atrial fibrillation.. Etiology is not clear. There is no clear source for infection could be identified. She does not have a fever, urinalysis negative, chest x-ray has no infiltration.  -will admit to [facility] -Broad antibiotics were started by ED,  vancomycin and Zosyn. Will continue until more clear work up done -blood and urine culture -will get Procalcitonin and trend lactic acid level -IVF: received 2L of NS bolus in ED, followed by 125 cc/h  Abdominal pain: Patient denies abdominal pain, but he has tenderness over left abdomen. Unclear etiology. Patient does not have nausea, vomiting or diarrhea. -will get CT-abc/pel -check lipase  Respiratory alkalosis: PH is 7.791, PCO2 14, PO2 133, carbonate 25.3. Etiology is not clear. VQ scan has low probability for pulmonary embolism. Simple explanationcould be from abdominal pain induced tachypnea, but nee to rule out toxicity, such as aspirin.  -Salicylate level -Acetaminophen level -pain control: prn morphine -hold ASA until salicylate level test done  Chest pain: Patient had transient chest pain, currently no chest pain. EKG with paced rhythm -trop x 3 -ekg in AM -morphine prn -on plavix -hold ASA as above  Syncope: Patient had syncope episode in emergency room, which lasted for a few seconds. Etiology is not clear. CT head is negative for acute abnormalities. Has no focal neurologic findings on examination. -mornitor on tele bed -Orthostatic vital signs -neuro check q4 -2d echo: Patient needs 2-D echo not only for syncope, but also for evaluating cardiac function since patient is on Lasix and spironolactone without clear diagnosis. The patient has history of bilateral leg edema.   -will also get BNP  Acute encephalopathy: Likely multifactorial, including chest pain, shortness of breath, hypoxia, abdominal pain, alkalosis, AKI. No signs for stroke. -Treat an underlying problems --neuro check q4  Atrial Fibrillation: CHA2DS2-VASc Score is 6  , needs oral anticoagulation. Patient is not on anticoagulants, possibly because of old age and high risk for fall. Heart rate is well controlled. -continue metoprolol for rate control -Hold digoxin until digitoxin level test done due to acute encephalopathy and syncope (need r/o digoxin toxicity due to CKD).   Aortic stenosis: post status of endarterectomy. The patient is on Plavix and aspirin at home -continue Plavix  -hold aspirin as above.  Diabetes mellitus: A1c was 11.2 on 07/14/13. Patient is on Amaryl at home. -Sliding-scale  insulin  Hyperlipidemia: Patient is on Zocor. No LDL record. -Continue Zocor. -Check FLP  Hypertension: Blood pressure is soft. -Hold the Lasix, and spironolactone -Continue metoprolol  CKD: Not sure which stage of chronic kidney disease patient has. Creatinine is 2.88 -check FeUrea -US-renal -IVF as above   DVT ppx: SQ Heparin     Code Status: Full code, patient states that he wants to be resuscitated if necessary Family Communication: None at bed side.  Disposition Plan: Admit to inpatient   Date of Service 07/25/2014    Lorretta Harp [facility] Pager [phone]  If 7PM-7AM, please contact night-coverage [email or web address] Password [facility] 07/25/2014, 10:07 PM

## 2014-07-25 NOTE — ED Notes (Signed)
Pt's step-daughter, Lurena JoinerRebecca ("Becky") Stump POA called for update. States will come tomorrow.  States to call with any concerns or questions. Phone number [phone]

## 2014-07-25 NOTE — ED Notes (Signed)
Admitting MD in to assess pt for admission 

## 2014-07-25 NOTE — ED Notes (Signed)
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 Hard of hearing.

## 2014-07-25 NOTE — ED Notes (Signed)
Pt remains on 15 L NRB at 100% O2 sats. Respiratory at bedside drawing ABG

## 2014-07-25 NOTE — ED Notes (Signed)
Pt in Radiology 

## 2014-07-25 NOTE — ED Notes (Signed)
[employer] Pacer Representative called states patient has had no recent events and is in Afib. Dr. Karma GanjaLinker aware.

## 2014-07-25 NOTE — ED Notes (Signed)
[place] Consulting civil engineerCharge RN at bedside to interrogate pacer.

## 2014-07-25 NOTE — ED Notes (Signed)
Pt states he cannot catch his breath, RR up to 35 at this time. Pt denies chest pain, but states he doesn't feel well. O2 sats dropped to 87% on RA, pt placed on 2 L O2 at this time. Pt states "I am pedaling" while he swings his arms.

## 2014-07-25 NOTE — ED Provider Notes (Signed)
CSN: [id number]     Arrival date & time 07/25/14  1225 History   First MD Initiated Contact with Patient 07/25/14 1225     Chief Complaint  Patient presents with  . Dizziness  . Chest Pain     (Consider location/radiation/quality/duration/timing/severity/associated sxs/prior Treatment) HPI  Pt presenting with c/o dizziness which has been going for the past several weeks.  He is unsure why he is in the ED today.  He states that his home helper was concerned about him and called EMS.  Upon arrival pt did briefly complain of chest pain and then syncopized briefly.  After several seconds he began responsive back to his baseline.  He denies feeling a shock from his aicd.   He states he has been feeling intermittently short of breath over the past several months.  Currently states he is not short of breath during my evaluation.  There are no other associated systemic symptoms, there are no other alleviating or modifying factors.   Past Medical History  Diagnosis Date  . Peripheral blood vessel disorder   . BP (high blood pressure)   . Arteriosclerosis of coronary artery   . Impaired renal function   . Dizziness   . Diabetes mellitus, type 2   . Cardiac murmur   . Abnormal blood level of lithium    Past Surgical History  Procedure Laterality Date  . Cardiac pacemaker placement    . Hernia repair    . Carotid endarterectomy     History reviewed. No pertinent family history. History  Substance Use Topics  . Smoking status: Former Smoker    Quit date: 05/29/1961  . Smokeless tobacco: Not on file  . Alcohol Use: Not on file    Review of Systems  ROS reviewed and all otherwise negative except for mentioned in HPI    Allergies  Contrast media  Home Medications   Prior to Admission medications   Medication Sig Start Date End Date Taking? Authorizing Provider  aspirin 81 MG tablet Take 81 mg by mouth daily.   Yes Historical Provider, MD  calcium carbonate (OS-CAL) 600 MG TABS  tablet Take 600 mg by mouth daily.   Yes Historical Provider, MD  Calcium Carbonate-Vit D-Min (CALCIUM 1200 PO) Take 1 tablet by mouth daily.   Yes Historical Provider, MD  Cholecalciferol (VITAMIN D-3 PO) Take 1,000 Units by mouth daily.   Yes Historical Provider, MD  clopidogrel (PLAVIX) 75 MG tablet Take 75 mg by mouth daily with breakfast.   Yes Historical Provider, MD  digoxin (LANOXIN) 0.125 MG tablet Take 0.125 mg by mouth daily.   Yes Historical Provider, MD  diphenhydrAMINE (BENADRYL) 25 mg capsule Take 25 mg by mouth as needed.   Yes Historical Provider, MD  furosemide (LASIX) 20 MG tablet Take 1 tablet (20 mg total) by mouth daily. 10/13/13  Yes Lennette Bihari, MD  glimepiride (AMARYL) 2 MG tablet Take 2 mg by mouth 2 (two) times daily.    Yes Historical Provider, MD  metoprolol (LOPRESSOR) 50 MG tablet Take 50 mg by mouth 2 (two) times daily.   Yes Historical Provider, MD  PRESCRIPTION MEDICATION Take 1 tablet by mouth daily. "Prescription med for diabetes, in addition to glimepiride, maybe metformin"   Yes Historical Provider, MD  simvastatin (ZOCOR) 20 MG tablet Take 20 mg by mouth daily.   Yes Historical Provider, MD  spironolactone (ALDACTONE) 25 MG tablet Take 25 mg by mouth 3 (three) times a week.    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, blind Mouth - mucous membranes moist, pharynx normal without lesions Chest - clear to auscultation, no wheezes, rales or rhonchi, symmetric air entry, pacer palpable in left upper chest wall Heart - normal rate, regular rhythm, normal S1, S2, no murmurs, rubs, clicks or gallops Abdomen  - soft, nontender, nondistended, no masses or organomegaly Neurological - alert, oriented x 2, moving all extremities, no cranial nerve deficit Extremities - peripheral pulses normal, no pedal edema, no clubbing or cyanosis Skin - normal coloration and turgor, no rashes  ED Course  Procedures (including critical care time)  CRITICAL CARE Performed by: Ethelda ChickLINKER,Jeren Dufrane K Total critical care time: 50 Critical care time was exclusive of separately billable procedures and treating other patients. Critical care was necessary to treat or prevent imminent or life-threatening deterioration. Critical care was time spent personally by me on the following activities: development of treatment plan with patient and/or surrogate as well as nursing, discussions with consultants, evaluation of patient's response to treatment, examination of patient, obtaining history from patient or surrogate, ordering and performing treatments and interventions, ordering and review of laboratory studies, ordering and review of radiographic studies, pulse oximetry and re-evaluation of patient's condition.  2:58 PM d/w PCCM, Dr. Molli KnockYacoub, he does not have any further recommendations at this time.   Labs Review Labs Reviewed  BASIC METABOLIC PANEL - Abnormal; Notable for the following:    Chloride 93 (*)    Glucose, Bld 178 (*)    BUN 48 (*)    Creatinine, Ser 2.88 (*)    GFR calc non Af Amer 17 (*)    GFR calc Af Amer 20 (*)    Anion gap 16 (*)    All other components within normal limits  URINALYSIS W MICROSCOPIC - Abnormal; Notable for the following:    Ketones, ur 15 (*)    Bacteria, UA FEW (*)    All other components within normal limits  ACETAMINOPHEN LEVEL - Abnormal; Notable for the following:    Acetaminophen (Tylenol), Serum <10.0 (*)    All other components within normal limits  TROPONIN I - Abnormal; Notable for the following:    Troponin I 0.13 (*)    All other components within normal limits  TROPONIN I -  Abnormal; Notable for the following:    Troponin I 0.22 (*)    All other components within normal limits  TROPONIN I - Abnormal; Notable for the following:    Troponin I 0.21 (*)    All other components within normal limits  BRAIN NATRIURETIC PEPTIDE - Abnormal; Notable for the following:    B Natriuretic Peptide 746.8 (*)    All other components within normal limits  COMPREHENSIVE METABOLIC PANEL - Abnormal; Notable for the following:    Potassium 3.3 (*)    Glucose, Bld 191 (*)    BUN 41 (*)    Creatinine, Ser 2.72 (*)    Calcium 7.5 (*)    Albumin 3.0 (*)    AST 49 (*)    Total Bilirubin 2.8 (*)    GFR calc non  Af Amer 19 (*)    GFR calc Af Amer 21 (*)    All other components within normal limits  CBC - Abnormal; Notable for the following:    WBC 11.9 (*)    All other components within normal limits  LIPID PANEL - Abnormal; Notable for the following:    Triglycerides 156 (*)    HDL 29 (*)    All other components within normal limits  CK TOTAL AND CKMB - Abnormal; Notable for the following:    Total CK 306 (*)    CK, MB 9.1 (*)    Relative Index 3.0 (*)    All other components within normal limits  GLUCOSE, CAPILLARY - Abnormal; Notable for the following:    Glucose-Capillary 184 (*)    All other components within normal limits  CBC - Abnormal; Notable for the following:    WBC 14.8 (*)    All other components within normal limits  GLUCOSE, CAPILLARY - Abnormal; Notable for the following:    Glucose-Capillary 157 (*)    All other components within normal limits  I-STAT ARTERIAL BLOOD GAS, ED - Abnormal; Notable for the following:    pH, Arterial 7.828 (*)    pCO2 arterial 17.3 (*)    pO2, Arterial 358.0 (*)    Bicarbonate 28.8 (*)    Acid-Base Excess 12.0 (*)    All other components within normal limits  I-STAT CG4 LACTIC ACID, ED - Abnormal; Notable for the following:    Lactic Acid, Venous 4.26 (*)    All other components within normal limits  I-STAT CG4 LACTIC  ACID, ED - Abnormal; Notable for the following:    Lactic Acid, Venous 3.40 (*)    All other components within normal limits  I-STAT ARTERIAL BLOOD GAS, ED - Abnormal; Notable for the following:    pH, Arterial 7.791 (*)    pCO2 arterial 14.0 (*)    pO2, Arterial 133.0 (*)    Acid-Base Excess 6.0 (*)    All other components within normal limits  CBG MONITORING, ED - Abnormal; Notable for the following:    Glucose-Capillary 174 (*)    All other components within normal limits  CBG MONITORING, ED - Abnormal; Notable for the following:    Glucose-Capillary 189 (*)    All other components within normal limits  MRSA PCR SCREENING  CULTURE, BLOOD (ROUTINE X 2)  CULTURE, BLOOD (ROUTINE X 2)  URINE CULTURE  CBC  SALICYLATE LEVEL  DIGOXIN LEVEL  LIPASE, BLOOD  CREATININE, URINE, RANDOM  BLOOD GAS, ARTERIAL  HEMOGLOBIN A1C  UREA NITROGEN, URINE  COMPREHENSIVE METABOLIC PANEL  I-STAT TROPOININ, ED    Imaging Review Ct Abdomen Pelvis Wo Contrast  07/25/2014   CLINICAL DATA:  Abdominal pain  EXAM: CT ABDOMEN AND PELVIS WITHOUT CONTRAST  TECHNIQUE: Multidetector CT imaging of the abdomen and pelvis was performed following the standard protocol without IV contrast.  COMPARISON:  None.  FINDINGS: Lung bases shows probable chronic mild interstitial prominence. Small granulomas are noted right lower lobe the largest calcified granuloma right base anteriorly measures 7.6 mm.  Sagittal images of the spine shows mild degenerative changes. Diffuse osteopenia. Unenhanced liver shows no biliary ductal dilatation. There is a large calcified gallstone within gallbladder measures 2 cm. Mild thickening of gallbladder wall. No pericholecystic fluid. Further correlation with gallbladder ultrasound could be performed as clinically warranted.  Atherosclerotic calcifications of abdominal aorta bilateral renal artery. Left renal artery stents are noted. Atherosclerotic calcifications of splenic artery. There  is focal  aneurysmal dilatation of distal abdominal aorta measures 2.6 by 2.7 cm. Atherosclerotic calcifications of common iliac arteries. Mild bilateral renal atrophy. Nonobstructive punctate calculus in upper pole of the right kidney measures 2 mm.  No hydronephrosis or hydroureter.  No calcified ureteral calculi.  No small bowel obstruction.  No free abdominal air.  No adenopathy.  Moderate stool noted in distal colon. Small amount of pelvic free fluid noted posterior pelvis. Mild enlarged prostate gland with indentation of urinary bladder base. Prostate gland measures 4.2 x 5.7 cm.  No pericecal inflammation.  IMPRESSION: 1. There is right nonobstructive nephrolithiasis. No hydronephrosis or hydroureter. No calcified ureteral calculi. 2. There is a calcified gallstone within gallbladder measures 2 cm. Mild thickening of gallbladder wall without pericholecystic fluid. Further correlation with gallbladder ultrasound could be performed as clinically warranted. 3. Mild focal aneurysmal dilatation of distal abdominal aorta measures 2.7 x 2.6 cm. 4. No small bowel obstruction. 5. There is an enlarged prostate gland with indentation of urinary bladder base.   Electronically Signed   By: Natasha Mead M.D.   On: 07/25/2014 21:57   Dg Chest 2 View  07/25/2014   CLINICAL DATA:  Dizziness for several months. Chest pain. Bradycardia.  EXAM: CHEST  2 VIEW  COMPARISON:  None.  FINDINGS: Cardiomegaly. Hyperinflation. Dual lead pacer with atrial and ventricular leads, grossly unremarkable. Mild bronchitic change. Scattered granulomata. No active infiltrates or failure. No effusion or pneumothorax. Calcified tortuous aorta. Osteopenia.  IMPRESSION: Cardiomegaly.  COPD.  No active disease.   Electronically Signed   By: Davonna Belling M.D.   On: 07/25/2014 13:55   Ct Head Wo Contrast  07/25/2014   CLINICAL DATA:  Dizziness for several months.  Confusion.  EXAM: CT HEAD WITHOUT CONTRAST  TECHNIQUE: Contiguous axial images were obtained from  the base of the skull through the vertex without intravenous contrast.  COMPARISON:  None.  FINDINGS: There is no acute intracranial hemorrhage, acute infarction, or mass lesion. There is diffuse cerebral cortical atrophy with secondary ventricular dilatation consistent with the patient's age. There is minimal periventricular white matter lucency consistent with slight chronic small vessel ischemic disease.  There are few opacified right mastoid air cells. This is not felt to be significant. Osseous structures are otherwise normal.  IMPRESSION: No acute intracranial abnormality. Age related atrophy. Minimal chronic periventricular small vessel disease.   Electronically Signed   By: Francene Boyers M.D.   On: 07/25/2014 18:51   US Renal  07/25/2014   CLINICAL DATA:  Acute onset of renal insufficiency. Renal atrophy and stones noted on CT. Initial encounter.  EXAM: RENAL/URINARY TRACT ULTRASOUND COMPLETE  COMPARISON:  CT of the abdomen and pelvis performed earlier today at 9:37 p.m.  FINDINGS: Right Kidney:  Length: 9.5 cm. Echogenicity within normal limits. Parenchymal echogenicity is borderline normal in thickness. The known tiny right renal stone is not well characterized on ultrasound. No mass or hydronephrosis visualized.  Left Kidney:  Length: 11.6 cm. Echogenicity within normal limits. No mass or hydronephrosis visualized.  Bladder:  Appears normal for degree of bladder distention. A borderline enlarged prostate is noted, impressing on the base of the bladder.  A large stone is noted within the gallbladder. Gallbladder wall thickening is noted, measuring 5 mm. Would correlate for symptoms of cholecystitis.  IMPRESSION: 1. No evidence of hydronephrosis. Known tiny right renal stone not well characterized on ultrasound. 2. Borderline enlarged prostate, impressing on the base of the bladder. 3. Cholelithiasis. Gallbladder wall thickening measures  5 mm. Would correlate for evidence of cholecystitis.    Electronically Signed   By: Roanna Raider M.D.   On: 07/25/2014 23:39   Nm Pulmonary Perf And Vent  07/25/2014   CLINICAL DATA:  81 year old male with bradycardia and shortness of Breath. Initial encounter.  EXAM: NUCLEAR MEDICINE VENTILATION - PERFUSION LUNG SCAN  TECHNIQUE: Ventilation images were obtained in multiple projections using inhaled aerosol technetium 99 M DTPA. Perfusion images were obtained in multiple projections after intravenous injection of Tc-72m MAA.  RADIOPHARMACEUTICALS:  40.0 mCi Tc-66m DTPA aerosol and 6.0 mCi Tc-66m MAA  COMPARISON:  Chest radiographs 1331 hr today.  FINDINGS: The patient's arms could not be raised for these images.  Ventilation: Mildly heterogeneous ventilation in both lungs, no discrete ventilation defect.  Perfusion: No wedge shaped peripheral perfusion defects to suggest acute pulmonary embolism.  IMPRESSION: Very low probability of acute pulmonary embolus.   Electronically Signed   By: Odessa Fleming M.D.   On: 07/25/2014 16:26     EKG Interpretation   Date/Time:  Tuesday July 25 2014 12:36:21 EDT Ventricular Rate:  70 PR Interval:    QRS Duration: 155 QT Interval:  496 QTC Calculation: 535 R Axis:   -86 Text Interpretation:  Accelerated junctional rhythm LVH with IVCD, LAD and  secondary repol abnrm Inferior infarct, acute (RCA) Prolonged QT interval  Probable RV involvement, suggest recording right precordial leads No  significant change since last tracing Confirmed by [facility]  MD, Latonya Knight  [phone]) on 07/25/2014 3:31:53 PM      MDM   Final diagnoses:  Chest pain  Confusion  Alkalosis  Syncope, unspecified syncope type  AKI (acute kidney injury)    Pt  With brief episode of chest pain upon arrival with brief syncopal event.  After several seconds and no intervention he returned to baseline.  During workup in the ED he began to c/o feeling short of breath.  Intermittently he became hypoxic into the 80s.  Pt with elevated creatinine so VQ scan  obtained.   Pt placed on O2 by facemask with increase in O2 sats to 100%.  ABG obtained and shows ph 7.8, pCO2 17, P02 350s.  D/w critical care in consulation, Dr. Molli Knock without specific recommendations.  Pt is not significantly tachypneic, bicarb on BMP is normal.  Will get lactate, urine and check temp to ensure no sepsis as cause.  Pt signed out to Dr. Loretha Stapler pending remainder of workup and will need admission.    of note, d/w patient about whether he would want to be intubated if needed for respiratory problems, he states he would want to be "on the machine" if it would give him more time.  It is unclear how well he understands this, and as of now is full code.      Jerelyn Scott, MD 07/27/14 0800

## 2014-07-25 NOTE — ED Notes (Signed)
Pt not in room - in nuc med.

## 2014-07-25 NOTE — ED Notes (Signed)
Upon entering pt room, pt pulling cords and becoming very restless. Does not know where he is. O2 sats 100% on RA. MD aware at this time.

## 2014-07-25 NOTE — ED Notes (Signed)
Dr. Linker at bedside  

## 2014-07-25 NOTE — Progress Notes (Signed)
ANTIBIOTIC CONSULT NOTE - INITIAL  Pharmacy Consult for Vancomycin Indication: Sepsis   Allergies  Allergen Reactions  . Contrast Media [Iodinated Diagnostic Agents] Shortness Of Breath    Patient Measurements:   Adjusted Body Weight: pending   Vital Signs: Temp: 98.5 F (36.9 C) (03/29 1716) Temp Source: Rectal (03/29 1716) BP: 109/45 mmHg (03/29 1715) Pulse Rate: 70 (03/29 1715) Intake/Output from previous day:   Intake/Output from this shift:    Labs:  Recent Labs  07/25/14 1240  WBC 9.7  HGB 15.0  PLT 213  CREATININE 2.88*   CrCl cannot be calculated (Unknown ideal weight.). No results for input(s): VANCOTROUGH, VANCOPEAK, VANCORANDOM, GENTTROUGH, GENTPEAK, GENTRANDOM, TOBRATROUGH, TOBRAPEAK, TOBRARND, AMIKACINPEAK, AMIKACINTROU, AMIKACIN in the last 72 hours.   Microbiology: No results found for this or any previous visit (from the past 720 hour(s)).  Medical History: Past Medical History  Diagnosis Date  . Peripheral blood vessel disorder   . BP (high blood pressure)   . Arteriosclerosis of coronary artery   . Impaired renal function   . Dizziness   . Diabetes mellitus, type 2   . Chest pain   . Cardiac murmur   . Abnormal blood level of lithium     Medications:   (Not in a hospital admission) Assessment: [age] YOM with h/o CKD (unsure of stage) presented with c/o dizziness x several weeks along with mild confusion. Pharmacy consulted to start Vancomycin for sepsis. He has already received a dose of Zosyn in the ED.   WBC is wnl; Pt is afebrile; LA 4.26  Cultures: 3/29 Blood Cx x2>>  Goal of Therapy:  Vancomycin trough level 15-20 mcg/ml   Plan:  -Give Vancomycin 1500 mg IV once. Order maintenance dose once patient's weight has been updated.  -Monitor CBC, renal rx, cultures and patient's clinical progress -[facility] -F/u if Zosyn is resumed on admission.   Vinnie LevelBenjamin Coreena Rubalcava, PharmD., BCPS Clinical Pharmacist Pager [phone]

## 2014-07-25 NOTE — ED Notes (Signed)
Flow called to inquire about bed request.  

## 2014-07-25 NOTE — ED Notes (Signed)
Phlebotomy at bedside.

## 2014-07-25 NOTE — ED Notes (Signed)
Patient transported to CT 

## 2014-07-25 NOTE — ED Notes (Signed)
Pt sats down to 85% on 2L. O2 turned up to 5L sats at 96%. Pt alert but unaware of where he is or what month we are in. Can tell me its 2016.

## 2014-07-25 NOTE — ED Notes (Addendum)
Pt brought from home via [facility] with c/o dizziness x1 several months.  Pt denies pain with EMS but upon triage pt began complaining of chest pain, heart rate noted to drop to 32bpm and patient was not responsive for approx 15seconds.  Pt awoke and is now alert and interactive, answering questions appropriately. 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 VSS at this time. Pt maintained +2 radial pulses during unresponsiveness.

## 2014-07-25 NOTE — ED Notes (Signed)
Pt sats dropped to 77% on 5L O2. NRB started at 15L. Sats at 90%. Dr Karma GanjaLinker at bedside. Pt denying any pain

## 2014-07-25 NOTE — ED Notes (Signed)
PT to ultrasound at this time.

## 2014-07-25 NOTE — ED Provider Notes (Signed)
Care assumed from Dr. Karma GanjaLinker. Patient was getting his VQ scan at time of transfer of care.  The scan was negative. On re-eval, patient was afebrile, alert, oriented, but slightly confused. CT of his head and repeat blood gas were obtained. Blood gas was unchanged and CT of head was negative. Treated empirically for sepsis when his lactate came back elevated. He remained hemodynamically stable. Source of his alkalosis is unclear, but additional labs including tox levels pending. Dr. Clyde LundborgNiu will admit to the stepdown unit.  CRITICAL CARE Performed by: Merrie RoofWOFFORD III, Charles Ramsey   Total critical care time: 35min  Critical care time was exclusive of separately billable procedures and treating other patients.  Critical care was necessary to treat or prevent imminent or life-threatening deterioration.  Critical care was time spent personally by me on the following activities: development of treatment plan with patient and/or surrogate as well as nursing, discussions with consultants, evaluation of patient's response to treatment, examination of patient, obtaining history from patient or surrogate, ordering and performing treatments and interventions, ordering and review of laboratory studies, ordering and review of radiographic studies, pulse oximetry and re-evaluation of patient's condition.   Clinical Impression: 1. Alkalosis   2. Chest pain   3. SOB (shortness of breath)   4. Confusion   5. Abdominal pain   6. Syncope, unspecified syncope type       Charles DivineJohn Zorian Gunderman, MD 07/25/14 2329

## 2014-07-26 ENCOUNTER — Encounter (HOSPITAL_COMMUNITY): Payer: Self-pay | Admitting: Cardiology

## 2014-07-26 DIAGNOSIS — R42 Dizziness and giddiness: Secondary | ICD-10-CM

## 2014-07-26 DIAGNOSIS — I350 Nonrheumatic aortic (valve) stenosis: Secondary | ICD-10-CM

## 2014-07-26 DIAGNOSIS — I482 Chronic atrial fibrillation: Secondary | ICD-10-CM

## 2014-07-26 DIAGNOSIS — G9340 Encephalopathy, unspecified: Secondary | ICD-10-CM

## 2014-07-26 DIAGNOSIS — I441 Atrioventricular block, second degree: Secondary | ICD-10-CM | POA: Insufficient documentation

## 2014-07-26 LAB — COMPREHENSIVE METABOLIC PANEL
ALK PHOS: 63 U/L (ref 39–117)
ALT: 19 U/L (ref 0–53)
AST: 49 U/L — AB (ref 0–37)
Albumin: 3 g/dL — ABNORMAL LOW (ref 3.5–5.2)
Anion gap: 11 (ref 5–15)
BUN: 41 mg/dL — ABNORMAL HIGH (ref 6–23)
CHLORIDE: 105 mmol/L (ref 96–112)
CO2: 26 mmol/L (ref 19–32)
CREATININE: 2.72 mg/dL — AB (ref 0.50–1.35)
Calcium: 7.5 mg/dL — ABNORMAL LOW (ref 8.4–10.5)
GFR calc Af Amer: 21 mL/min — ABNORMAL LOW (ref >90)
GFR calc non Af Amer: 19 mL/min — ABNORMAL LOW (ref >90)
Glucose, Bld: 191 mg/dL — ABNORMAL HIGH (ref 70–99)
POTASSIUM: 3.3 mmol/L — AB (ref 3.5–5.1)
Sodium: 142 mmol/L (ref 135–145)
Total Bilirubin: 2.8 mg/dL — ABNORMAL HIGH (ref 0.3–1.2)
Total Protein: 6.1 g/dL (ref 6.0–8.3)

## 2014-07-26 LAB — LIPID PANEL
CHOL/HDL RATIO: 4.3 ratio
Cholesterol: 126 mg/dL (ref 0–200)
HDL: 29 mg/dL — AB (ref >39)
LDL Cholesterol: 66 mg/dL (ref 0–99)
Triglycerides: 156 mg/dL — ABNORMAL HIGH (ref <150)
VLDL: 31 mg/dL (ref 0–40)

## 2014-07-26 LAB — GLUCOSE, CAPILLARY
Glucose-Capillary: 184 mg/dL — ABNORMAL HIGH (ref 70–99)
Glucose-Capillary: 157 mg/dL — ABNORMAL HIGH (ref 70–99)

## 2014-07-26 LAB — CBC
HCT: 41.8 % (ref 39.0–52.0)
HEMOGLOBIN: 13.8 g/dL (ref 13.0–17.0)
MCH: 31.8 pg (ref 26.0–34.0)
MCHC: 33 g/dL (ref 30.0–36.0)
MCV: 96.3 fL (ref 78.0–100.0)
Platelets: 198 10*3/uL (ref 150–400)
RBC: 4.34 MIL/uL (ref 4.22–5.81)
RDW: 13.4 % (ref 11.5–15.5)
WBC: 11.9 10*3/uL — ABNORMAL HIGH (ref 4.0–10.5)

## 2014-07-26 LAB — CK TOTAL AND CKMB (NOT AT ARMC)
CK TOTAL: 306 U/L — AB (ref 7–232)
CK, MB: 9.1 ng/mL — AB (ref 0.3–4.0)
Relative Index: 3 — ABNORMAL HIGH (ref 0.0–2.5)

## 2014-07-26 LAB — CBG MONITORING, ED
Glucose-Capillary: 174 mg/dL — ABNORMAL HIGH (ref 70–99)
Glucose-Capillary: 189 mg/dL — ABNORMAL HIGH (ref 70–99)

## 2014-07-26 LAB — CREATININE, URINE, RANDOM: Creatinine, Urine: 55.44 mg/dL

## 2014-07-26 LAB — I-STAT CG4 LACTIC ACID, ED: Lactic Acid, Venous: 3.4 mmol/L (ref 0.5–2.0)

## 2014-07-26 LAB — TROPONIN I
Troponin I: 0.13 ng/mL — ABNORMAL HIGH (ref <0.031)
Troponin I: 0.22 ng/mL — ABNORMAL HIGH (ref <0.031)
Troponin I: 0.21 ng/mL — ABNORMAL HIGH (ref <0.031)

## 2014-07-26 LAB — LIPASE, BLOOD: LIPASE: 40 U/L (ref 11–59)

## 2014-07-26 LAB — BRAIN NATRIURETIC PEPTIDE: B NATRIURETIC PEPTIDE 5: 746.8 pg/mL — AB (ref 0.0–100.0)

## 2014-07-26 LAB — MRSA PCR SCREENING: MRSA BY PCR: NEGATIVE

## 2014-07-26 MED ORDER — PIPERACILLIN-TAZOBACTAM IN DEX 2-0.25 GM/50ML IV SOLN
2.2500 g | Freq: Three times a day (TID) | INTRAVENOUS | Status: DC
  Administered 2014-07-26: 2.25 g via INTRAVENOUS
  Filled 2014-07-26 (×5): qty 50

## 2014-07-26 MED ORDER — SODIUM CHLORIDE 0.9 % IV BOLUS (SEPSIS)
1000.0000 mL | Freq: Once | INTRAVENOUS | Status: AC
  Administered 2014-07-26: 1000 mL via INTRAVENOUS

## 2014-07-26 MED ORDER — POTASSIUM CHLORIDE CRYS ER 20 MEQ PO TBCR
20.0000 meq | EXTENDED_RELEASE_TABLET | Freq: Once | ORAL | Status: AC
  Administered 2014-07-26: 20 meq via ORAL
  Filled 2014-07-26: qty 1

## 2014-07-26 MED ORDER — METOPROLOL TARTRATE 25 MG PO TABS
25.0000 mg | ORAL_TABLET | Freq: Two times a day (BID) | ORAL | Status: DC
  Administered 2014-07-26 – 2014-07-27 (×3): 25 mg via ORAL
  Filled 2014-07-26 (×5): qty 1

## 2014-07-26 NOTE — Evaluation (Addendum)
Occupational Therapy Evaluation Patient Details Name: Henery Betzold MRN: [id number] DOB: 06-28-1917 Today's Date: 07/26/2014    History of Present Illness Dewitte Vannice is a 81 y.o. male with past medical history of hypertension, pacemaker placement, coronary artery disease, peripheral vascular disease, chronic kidney disease (not sure which stage), atrial fibrillation not on anticoagulation, who presents with dizziness, shortness of breath, syncope and AMS.   Clinical Impression   PTA pt lived at ILF and was independent with ADLs and functional mobility. Pt had cleaning assistance from ILF 1x/week. Pt is limited by his low vision and unfamiliar environment as well as generalized weakness. Pt will benefit from SNF at d/c for ST rehab to increase strength and endurance prior to returning to [facility]. Pt will benefit from acute OT to promote independence with ADLs and functional mobility. VSS throughout session and no c/o dizziness.     Follow Up Recommendations  SNF;Supervision/Assistance - 24 hour    Equipment Recommendations  None recommended by OT    Recommendations for Other Services       Precautions / Restrictions Precautions Precautions: Fall Precaution Comments: pt fell last week Restrictions Weight Bearing Restrictions: No Other Position/Activity Restrictions: legally blind      Mobility Bed Mobility Overal bed mobility: Needs Assistance Bed Mobility: Supine to Sit;Sit to Supine     Supine to sit: Min assist;HOB elevated Sit to supine: Supervision   General bed mobility comments: Min A to guide hand to bed rail. Pt able to pull self to EOB and sit up with increased time. Supervision for return to supine.   Transfers Overall transfer level: Needs assistance Equipment used: None Transfers: Sit to/from Stand Sit to Stand: Min guard         General transfer comment: min-guard for safety but pt stands without physical assist or LOB, reports no dizziness at this  time         ADL Overall ADL's : Needs assistance/impaired Eating/Feeding: Set up;Sitting   Grooming: Set up;Sitting   Upper Body Bathing: Set up;Sitting   Lower Body Bathing: Minimal assistance;Sit to/from stand   Upper Body Dressing : Set up;Sitting   Lower Body Dressing: Minimal assistance;Sit to/from stand Lower Body Dressing Details (indicate cue type and reason): pt able to doff socks but required assist to don. Min guard to stand Toilet Transfer: Minimal assistance;Ambulation (hand held assist)           Functional mobility during ADLs: Minimal assistance (hand held assist) General ADL Comments: Pt limited by unfamiliar environment and low vision. Pt also limited by weakness.      Vision Additional Comments: Pt reports that he has mild peripheral vision from his left eye. He is able to see well lit areas.           Pertinent Vitals/Pain Pain Assessment: No/denies pain        Extremity/Trunk Assessment Upper Extremity Assessment Upper Extremity Assessment: Generalized weakness   Lower Extremity Assessment Lower Extremity Assessment: Generalized weakness   Cervical / Trunk Assessment Cervical / Trunk Assessment: Kyphotic   Communication Communication Communication: HOH (left hearing aid)   Cognition Arousal/Alertness: Awake/alert Behavior During Therapy: WFL for tasks assessed/performed Overall Cognitive Status: No family/caregiver present to determine baseline cognitive functioning       Memory: Decreased short-term memory                        Home Living Family/patient expects to be discharged to:: Skilled nursing facility  Additional Comments: pt from ILF but agreeable to SNF, [facility] and pt hopes to d/c home.       Prior Functioning/Environment Level of Independence: Independent        Comments: does not use AD but has been  progressively more light-headed at times lately. Fell bkwds 4 days ago. [place] [employer] [place] [employer], he "cooks" his breakfast and dinner but eats simple things like cereal and fruit.     OT Diagnosis: Generalized weakness;Cognitive deficits;Blindness and low vision   OT Problem List: Decreased strength;Impaired balance (sitting and/or standing);Decreased activity tolerance;Impaired vision/perception;Decreased cognition;Decreased safety awareness;Decreased knowledge of use of DME or AE   OT Treatment/Interventions: Self-care/ADL training;Therapeutic exercise;Energy conservation;DME and/or AE instruction;Therapeutic activities;Patient/family education;Balance training    OT Goals(Current goals can be found in the care plan section) Acute Rehab OT Goals Patient Stated Goal: to stay independent OT Goal Formulation: With patient Time For Goal Achievement: 08/09/14 Potential to Achieve Goals: Good ADL Goals Pt Will Perform Lower Body Bathing: with supervision;sit to/from stand Pt Will Perform Lower Body Dressing: with supervision;sit to/from stand Pt Will Transfer to Toilet: ambulating;with supervision Pt Will Perform Toileting - Clothing Manipulation and hygiene: with supervision;sit to/from stand  OT Frequency: Min 2X/week   Barriers to D/C: Decreased caregiver support             End of Session Nurse Communication: Other (comment) (vitals)  Activity Tolerance: Patient tolerated treatment well Patient left: in bed;with call bell/phone within reach;with family/visitor present   Time: [id number] OT Time Calculation (min): 30 min Charges:  OT General Charges $OT Visit: 1 Procedure OT Evaluation $Initial OT Evaluation Tier I: 1 Procedure OT Treatments $Self Care/Home Management : 8-22 mins G-Codes:    Rae LipsMiller, Janyla Biscoe M 07/26/2014, 4:19 PM  Carney LivingLeeAnn Marie Aarika Moon, OTR/L Occupational Therapist [phone] (pager)

## 2014-07-26 NOTE — ED Notes (Signed)
Pt had a mole on left side that he scratched and caused to bleed.  Dr. Unknown FoleyWolford made aware, dry sterile dressing applied.  No bleeding at this time

## 2014-07-26 NOTE — Progress Notes (Deleted)
Pt arrived from ED-VSS. CMT and elink notified of arrival.  Call bell within reach.   

## 2014-07-26 NOTE — Progress Notes (Signed)
Pacemaker interrogation:  SJM Accent DR RF implanted 08/11/2012  Mode DDIR 70-120 bpm  Atrial and ventricular lead function is normal.  Afib since 07/2013.   89% V paced No high ventricular rates  Normal device function  Hillis RangeJames Izella Ybanez MD, [facility] 07/26/2014 10:13 PM

## 2014-07-26 NOTE — Progress Notes (Signed)
Pt arrived from ED-VSS. CMT and elink notified of arrival.  Call bell within reach.

## 2014-07-26 NOTE — Progress Notes (Signed)
[facility] TEAM 1 - Stepdown/ICU TEAM Progress Note  Adela GlimpseMarvin Pollett OZH:[id number] DOB: 12/01/1917 DOA: 07/25/2014 PCP: Pamelia HoitWILSON,FRED HENRY, MD  Admit HPI / Brief Narrative: 81 y.o. male with a history of hypertension, pacemaker placement, coronary artery disease, peripheral vascular disease, chronic kidney disease, and atrial fibrillation not on anticoagulation who presented with c/o dizziness, shortness of breath, syncope, and AMS.  Patient lives in an independent living facility.  It seems the patient had been dizzy for several weeks. His home helper was concerned about him and called EMS. Upon arrival to ED, patient briefly complained of chest pain and then syncopized. After several seconds, he returned to his baseline.   In ED, chest x-ray showed COPD changes without infiltrate. Urinalysis was negative. Troponin negative. WBC 9.7. Intermittently tachycardia, lactate 4.26. Temperature 98.5, oxygen saturation decreased to 84% which improved with nasal cannula oxygen. Because of tachypnea, ABG was done, which showed pH 7.791, PCO2 14, PO2 133, bicarbonate 21.3. VQ scan was negative for pulmonary embolism. Cre 2.88 (not clear about baseline, but patient carries a diagnosis of chronic kidney disease). CT head was negative for acute abnormalities.    HPI/Subjective: Pt is alert and quite pleasant.  He denies any complaints at this time, w/ no cp, n/v, abdom pain, or sob.    Assessment/Plan:  Syncope - dizzyness Cards has seen - pacer to be interrogated - watch on tele for 24hrs - f/u TTE - not orthostatic presently  Lactic acidosis  Most likely due to simple DH and hypoperfusion - no evidence of sepsis or SIRS presently - trending down w/ volume expansion - slow rate of IVF in this elderly gentleman  Elevated troponin - Hx of CAD not a candidate for aggressive cardiac evaluation - no further ischemia workup indicated  Acute renal failure on CKD No hydronephrosis on renal US - crt improving  w/ hydration - baseline crt 1.29 June 2013  Mild hypokalemia Gently replace and follow  AoS Await TTE - could be source of syncope, in setting of apparent DH   HTN BP stable   DM CBG not ideal - follow w/o change for now - A1c pending   Chronic Afib  CHADSvasc 5 - clearly not a good candidate for anticoag given syncope and dizziness w/ advanced age - HR controlled - stopping digoxin given renal insuff and advanced age   Code Status: FULL Family Communication: no family present at time of exam Disposition Plan: SDU  [facility]   Procedures: TTE - pending   Antibiotics: Zosyn 3/29  Vanc 3/29  DVT prophylaxis: SQ heparin  Objective: Blood pressure 129/53, pulse 73, temperature 97.4 F (36.3 C), temperature source Oral, resp. rate 18, height 5\' 7"  (1.702 m), weight 56.3 kg (124 lb 1.9 oz), SpO2 100 %.  Intake/Output Summary (Last 24 hours) at 07/26/14 1703 Last data filed at 07/26/14 1600  Gross per 24 hour  Intake   3175 ml  Output    900 ml  Net   2275 ml   Exam: General: No acute respiratory distress Lungs: Clear to auscultation bilaterally without wheezes or crackles Cardiovascular: Regular rate and rhythm - hear sounds distant  Abdomen: Nontender, nondistended, soft, bowel sounds positive, no rebound, no ascites, no appreciable mass Extremities: No significant cyanosis, clubbing, or edema bilateral lower extremities  Data Reviewed: Basic Metabolic Panel:  Recent Labs Lab 07/25/14 1240 07/26/14 0500  NA 137 142  K 4.0 3.3*  CL 93* 105  CO2 28 26  GLUCOSE 178* 191*  BUN 48* 41*  CREATININE 2.88* 2.72*  CALCIUM 9.2 7.5*    Liver Function Tests:  Recent Labs Lab 07/26/14 0500  AST 49*  ALT 19  ALKPHOS 63  BILITOT 2.8*  PROT 6.1  ALBUMIN 3.0*    Recent Labs Lab 07/25/14 2055  LIPASE 40   CBC:  Recent Labs Lab 07/25/14 1240 07/26/14 0500  WBC 9.7 11.9*  HGB 15.0 13.8  HCT 44.6 41.8  MCV 95.3 96.3  PLT 213  198    Cardiac Enzymes:  Recent Labs Lab 07/26/14 0006 07/26/14 0500 07/26/14 1033  CKTOTAL 306*  --   --   CKMB 9.1*  --   --   TROPONINI 0.13* 0.22* 0.21*    CBG:  Recent Labs Lab 07/26/14 0849 07/26/14 1139 07/26/14 1238  GLUCAP 174* 189* 184*    Recent Results (from the past 240 hour(s))  MRSA PCR Screening     Status: None   Collection Time: 07/26/14 12:30 PM  Result Value Ref Range Status   MRSA by PCR NEGATIVE NEGATIVE Final    Comment:        The GeneXpert MRSA Assay (FDA approved for NASAL specimens only), is one component of a comprehensive MRSA colonization surveillance program. It is not intended to diagnose MRSA infection nor to guide or monitor treatment for MRSA infections.      Studies:  Recent x-ray studies have been reviewed in detail by the Attending Physician  Scheduled Meds:  Scheduled Meds: . aspirin  81 mg Oral Daily  . calcium carbonate  1 tablet Oral Daily  . calcium-vitamin D  1 tablet Oral Q supper  . cholecalciferol  1,000 Units Oral Daily  . clopidogrel  75 mg Oral Q breakfast  . heparin  5,000 Units Subcutaneous 3 times per day  . insulin aspart  0-9 Units Subcutaneous TID WC  . metoprolol  50 mg Oral BID  . piperacillin-tazobactam (ZOSYN)  IV  2.25 g Intravenous 3 times per day  . simvastatin  20 mg Oral Daily  . sodium chloride  3 mL Intravenous Q12H  . [START ON 07/27/2014] vancomycin  1,000 mg Intravenous Q48H    Time spent on care of this patient: 35 mins   Rondal Vandevelde T , MD   [facility] Office  [phone] Pager - Text Page per Loretha Stapler as per below:  On-Call/Text Page:      [email or web address]      password [email or web address]  If 7PM-7AM, please contact night-coverage [email or web address] Password [email or web address] 07/26/2014, 5:03 PM   LOS: 1 day

## 2014-07-26 NOTE — Evaluation (Signed)
Physical Therapy Evaluation Patient Details Name: Charles Ramsey MRN: [id number] DOB: 10-17-1917 Today's Date: 07/26/2014   History of Present Illness  Charles Ramsey is a 81 y.o. male with past medical history of hypertension, pacemaker placement, coronary artery disease, peripheral vascular disease, chronic kidney disease (not sure which stage), atrial fibrillation not on anticoagulation, who presents with dizziness, shortness of breath, syncope and AMS.  Clinical Impression  Pt admitted with above diagnosis. Pt currently with functional limitations due to the deficits listed below (see PT Problem List). Pt presents with mild balance deficits and recent fall. Pt agreeable to SNF for rehab before back to his own apt. PT will follow acutely to progress independence and safety with mobility to allow discharge to the venue listed below.       Follow Up Recommendations SNF;Supervision for mobility/OOB    Equipment Recommendations  Rolling walker with 5" wheels    Recommendations for Other Services OT consult     Precautions / Restrictions Precautions Precautions: Fall Precaution Comments: pt fell last week Restrictions Weight Bearing Restrictions: No Other Position/Activity Restrictions: legally blind      Mobility  Bed Mobility Overal bed mobility: Needs Assistance Bed Mobility: Supine to Sit;Sit to Supine     Supine to sit: Min assist Sit to supine: Supervision   General bed mobility comments: HOB elevated, min A to stabilize as pt came to sitting EOB, pt has been in bed all day in ED  Transfers Overall transfer level: Needs assistance Equipment used: None Transfers: Sit to/from Stand Sit to Stand: Min guard         General transfer comment: min-guard for safety but pt stands without physical assist or LOB, reports no dizziness at this time  Ambulation/Gait Ambulation/Gait assistance: Min assist Ambulation Distance (Feet): 20 Feet Assistive device: 1 person hand held  assist Gait Pattern/deviations: Step-through pattern;Trunk flexed Gait velocity: decreased   General Gait Details: pt reports that he uses no AD but unsteady without today so HHA given. Would possibly benefit from RW but will take training to get used to it with visual loss.   Stairs            Wheelchair Mobility    Modified Rankin (Stroke Patients Only)       Balance Overall balance assessment: Needs assistance Sitting-balance support: Feet supported;No upper extremity supported Sitting balance-Leahy Scale: Good     Standing balance support: No upper extremity supported;During functional activity Standing balance-Leahy Scale: Fair Standing balance comment: unable to accept more than mild challenges                             Pertinent Vitals/Pain Pain Assessment: No/denies pain  BP sitting 128/48   Standing 129/54    Home Living Family/patient expects to be discharged to:: Skilled nursing facility                 Additional Comments: pt from independent living at [facility] but agreeable to SNF at [facility] for short term at d/c    Prior Function Level of Independence: Independent         Comments: does not use AD but has been progressively more light-headed at times lately. Fell bkwds 4 days ago. [place] [employer] [place] [employer], he cooks his breakfast and dinner     Hand Dominance        Extremity/Trunk Assessment   Upper Extremity Assessment: Generalized weakness  Lower Extremity Assessment: Generalized weakness      Cervical / Trunk Assessment: Kyphotic  Communication   Communication: No difficulties  Cognition Arousal/Alertness: Awake/alert Behavior During Therapy: WFL for tasks assessed/performed Overall Cognitive Status: No family/caregiver present to determine baseline cognitive functioning       Memory: Decreased short-term memory (mild memory deficits it seems)               General Comments      Exercises        Assessment/Plan    PT Assessment Patient needs continued PT services  PT Diagnosis Difficulty walking   PT Problem List Decreased activity tolerance;Decreased balance;Decreased mobility;Decreased cognition;Decreased knowledge of use of DME;Decreased safety awareness;Decreased knowledge of precautions  PT Treatment Interventions DME instruction;Gait training;Functional mobility training;Therapeutic activities;Therapeutic exercise;Balance training;Patient/family education   PT Goals (Current goals can be found in the Care Plan section) Acute Rehab PT Goals Patient Stated Goal: better balance PT Goal Formulation: With patient Time For Goal Achievement: 08/09/14 Potential to Achieve Goals: Good    Frequency Min 3X/week   Barriers to discharge Decreased caregiver support lives in own apt    Co-evaluation               End of Session Equipment Utilized During Treatment: Gait belt Activity Tolerance: Patient tolerated treatment well Patient left: in bed;with call bell/phone within reach Nurse Communication: Mobility status         Time: 1610-96041400-1428 PT Time Calculation (min) (ACUTE ONLY): 28 min   Charges:   PT Evaluation $Initial PT Evaluation Tier I: 1 Procedure PT Treatments $Therapeutic Activity: 8-22 mins   PT G Codes:      Lyanne CoVictoria Hanan Moen, PT  Acute Rehab Services  [phone]   [place], [place] 07/26/2014, 2:40 PM

## 2014-07-26 NOTE — ED Notes (Signed)
Pt given a was given a washcloth to cover eyes.

## 2014-07-26 NOTE — ED Notes (Signed)
Admitting MD paged for diet request

## 2014-07-26 NOTE — ED Notes (Signed)
Pt states that he feels "woosy" when he stands.

## 2014-07-26 NOTE — Consult Note (Signed)
Primary cardiologist: Dr Claiborne Billings  HPI: 81 year old male with past medical history of coronary artery disease, previous pacemaker, hypertension, diabetes mellitus, atrial fibrillation, renal insufficiency for evaluation of chest pain. Patient received most of his previous care in [place]. He saw Dr. Claiborne Billings in June 2015. There is documentation that his LV function is normal and he has mild to moderate aortic stenosis. Patient is a very difficult historian. He states that for the past several months he has had "dizzy spells". He has fallen but denies syncope. He also denies any chest pain but occasionally has dyspnea. Patient was admitted with dizziness and altered mental status per the H&P.   (Not in a hospital admission)  Allergies  Allergen Reactions  . Contrast Media [Iodinated Diagnostic Agents] Shortness Of Breath    Past Medical History  Diagnosis Date  . Peripheral blood vessel disorder   . BP (high blood pressure)   . Arteriosclerosis of coronary artery   . Impaired renal function   . Dizziness   . Diabetes mellitus, type 2   . Cardiac murmur   . Abnormal blood level of lithium     Past Surgical History  Procedure Laterality Date  . Cardiac pacemaker placement    . Hernia repair    . Carotid endarterectomy      History   Social History  . Marital Status: Married    Spouse Name: N/A  . Number of Children: N/A  . Years of Education: N/A   Occupational History  . Not on file.   Social History Main Topics  . Smoking status: Former Smoker    Quit date: 05/29/1961  . Smokeless tobacco: Not on file  . Alcohol Use: Not on file  . Drug Use: Not on file  . Sexual Activity: Not on file   Other Topics Concern  . Not on file   Social History Narrative    No family history on file.  ROS:  no fevers; positive chills and productive cough but  hemoptysis, dysphasia, odynophagia, melena, hematochezia, dysuria, hematuria, rash, seizure activity, orthopnea, PND, pedal  edema, claudication. Remaining systems are negative.  Physical Exam:   Blood pressure 118/58, pulse 73, temperature 97.6 F (36.4 C), temperature source Oral, resp. rate 18, SpO2 100 %.  General:  Well developed/frail in NAD Skin warm/dry Patient not depressed No peripheral clubbing Back-normal HEENT-blind Neck supple/normal carotid upstroke bilaterally; no bruits; no JVD; no thyromegaly chest - CTA/ normal expansion CV - RRR/normal S1 and S2; no rubs or gallops;  PMI nondisplaced Abdomen -NT/ND, no HSM, no mass, + bowel sounds, no bruit 2+ femoral pulses, no bruits Ext-no edema, chords; diminished distal pulses. Neuro-grossly nonfocal  ECG AV paced  Results for orders placed or performed during the hospital encounter of 07/25/14 (from the past 48 hour(s))  CBC     Status: None   Collection Time: 07/25/14 12:40 PM  Result Value Ref Range   WBC 9.7 4.0 - 10.5 K/uL   RBC 4.68 4.22 - 5.81 MIL/uL   Hemoglobin 15.0 13.0 - 17.0 g/dL   HCT 44.6 39.0 - 52.0 %   MCV 95.3 78.0 - 100.0 fL   MCH 32.1 26.0 - 34.0 pg   MCHC 33.6 30.0 - 36.0 g/dL   RDW 13.2 11.5 - 15.5 %   Platelets 213 150 - 400 K/uL  Basic metabolic panel     Status: Abnormal   Collection Time: 07/25/14 12:40 PM  Result Value Ref Range   Sodium 137 135 -  145 mmol/L   Potassium 4.0 3.5 - 5.1 mmol/L   Chloride 93 (L) 96 - 112 mmol/L   CO2 28 19 - 32 mmol/L   Glucose, Bld 178 (H) 70 - 99 mg/dL   BUN 48 (H) 6 - 23 mg/dL   Creatinine, Ser 2.88 (H) 0.50 - 1.35 mg/dL   Calcium 9.2 8.4 - 10.5 mg/dL   GFR calc non Af Amer 17 (L) >90 mL/min   GFR calc Af Amer 20 (L) >90 mL/min    Comment: (NOTE) The eGFR has been calculated using the CKD EPI equation. This calculation has not been validated in all clinical situations. eGFR's persistently <90 mL/min signify possible Chronic Kidney Disease.    Anion gap 16 (H) 5 - 15  I-stat troponin, ED     Status: None   Collection Time: 07/25/14 12:53 PM  Result Value Ref Range    Troponin i, poc 0.05 0.00 - 0.08 ng/mL   Comment 3            Comment: Due to the release kinetics of cTnI, a negative result within the first hours of the onset of symptoms does not rule out myocardial infarction with certainty. If myocardial infarction is still suspected, repeat the test at appropriate intervals.   I-Stat Arterial Blood Gas, ED - (order at [facility] and [facility] only)     Status: Abnormal   Collection Time: 07/25/14  2:28 PM  Result Value Ref Range   pH, Arterial 7.828 (HH) 7.350 - 7.450   pCO2 arterial 17.3 (LL) 35.0 - 45.0 mmHg   pO2, Arterial 358.0 (H) 80.0 - 100.0 mmHg   Bicarbonate 28.8 (H) 20.0 - 24.0 mEq/L   TCO2 29 0 - 100 mmol/L   O2 Saturation 100.0 %   Acid-Base Excess 12.0 (H) 0.0 - 2.0 mmol/L   Patient temperature 97.0 F    Collection site RADIAL, ALLEN'S TEST ACCEPTABLE    Drawn by RT    Sample type ARTERIAL    Comment NOTIFIED PHYSICIAN   I-Stat CG4 Lactic Acid, ED     Status: Abnormal   Collection Time: 07/25/14  4:59 PM  Result Value Ref Range   Lactic Acid, Venous 4.26 (HH) 0.5 - 2.0 mmol/L   Comment NOTIFIED PHYSICIAN   I-Stat arterial blood gas, ED     Status: Abnormal   Collection Time: 07/25/14  6:19 PM  Result Value Ref Range   pH, Arterial 7.791 (HH) 7.350 - 7.450   pCO2 arterial 14.0 (LL) 35.0 - 45.0 mmHg   pO2, Arterial 133.0 (H) 80.0 - 100.0 mmHg   Bicarbonate 21.3 20.0 - 24.0 mEq/L   TCO2 22 0 - 100 mmol/L   O2 Saturation 100.0 %   Acid-Base Excess 6.0 (H) 0.0 - 2.0 mmol/L   Patient temperature 98.6 F    Collection site RADIAL, ALLEN'S TEST ACCEPTABLE    Drawn by Operator    Sample type ARTERIAL    Comment NOTIFIED PHYSICIAN   Urinalysis with microscopic     Status: Abnormal   Collection Time: 07/25/14  6:28 PM  Result Value Ref Range   Color, Urine YELLOW YELLOW   APPearance CLEAR CLEAR   Specific Gravity, Urine 1.008 1.005 - 1.030   pH 6.5 5.0 - 8.0   Glucose, UA NEGATIVE NEGATIVE mg/dL   Hgb urine dipstick NEGATIVE NEGATIVE    Bilirubin Urine NEGATIVE NEGATIVE   Ketones, ur 15 (A) NEGATIVE mg/dL   Protein, ur NEGATIVE NEGATIVE mg/dL   Urobilinogen, UA 1.0 0.0 -  1.0 mg/dL   Nitrite NEGATIVE NEGATIVE   Leukocytes, UA NEGATIVE NEGATIVE   WBC, UA 0-2 <3 WBC/hpf   Bacteria, UA FEW (A) RARE   Squamous Epithelial / LPF RARE RARE  Creatinine, urine, random     Status: None   Collection Time: 07/25/14  6:28 PM  Result Value Ref Range   Creatinine, Urine 60.60 mg/dL  Salicylate level     Status: None   Collection Time: 07/25/14  8:54 PM  Result Value Ref Range   Salicylate Lvl <0.4 2.8 - 20.0 mg/dL  Acetaminophen level     Status: Abnormal   Collection Time: 07/25/14  8:54 PM  Result Value Ref Range   Acetaminophen (Tylenol), Serum <10.0 (L) 10 - 30 ug/mL    Comment:        THERAPEUTIC CONCENTRATIONS VARY SIGNIFICANTLY. A RANGE OF 10-30 ug/mL MAY BE AN EFFECTIVE CONCENTRATION FOR MANY PATIENTS. HOWEVER, SOME ARE BEST TREATED AT CONCENTRATIONS OUTSIDE THIS RANGE. ACETAMINOPHEN CONCENTRATIONS >150 ug/mL AT 4 HOURS AFTER INGESTION AND >50 ug/mL AT 12 HOURS AFTER INGESTION ARE OFTEN ASSOCIATED WITH TOXIC REACTIONS.   Digoxin level     Status: None   Collection Time: 07/25/14  8:55 PM  Result Value Ref Range   Digoxin Level 1.5 0.8 - 2.0 ng/mL  Lipase, blood     Status: None   Collection Time: 07/25/14  8:55 PM  Result Value Ref Range   Lipase 40 11 - 59 U/L  Troponin I (q 6hr x 3)     Status: Abnormal   Collection Time: 07/26/14 12:06 AM  Result Value Ref Range   Troponin I 0.13 (H) <0.031 ng/mL    Comment:        PERSISTENTLY INCREASED TROPONIN VALUES IN THE RANGE OF 0.04-0.49 ng/mL CAN BE SEEN IN:       -UNSTABLE ANGINA       -CONGESTIVE HEART FAILURE       -MYOCARDITIS       -CHEST TRAUMA       -ARRYHTHMIAS       -LATE PRESENTING MYOCARDIAL INFARCTION       -COPD   CLINICAL FOLLOW-UP RECOMMENDED.    CK total and CKMB (cardiac)     Status: Abnormal   Collection Time: 07/26/14 12:06 AM    Result Value Ref Range   Total CK 306 (H) 7 - 232 U/L   CK, MB 9.1 (HH) 0.3 - 4.0 ng/mL    Comment: REPEATED TO VERIFY CRITICAL RESULT CALLED TO, READ BACK BY AND VERIFIED WITH: M.PEPSICO RN [phone] 07/26/14 E.GADDY    Relative Index 3.0 (H) 0.0 - 2.5  I-Stat CG4 Lactic Acid, ED     Status: Abnormal   Collection Time: 07/26/14 12:13 AM  Result Value Ref Range   Lactic Acid, Venous 3.40 (HH) 0.5 - 2.0 mmol/L   Comment NOTIFIED PHYSICIAN   Troponin I (q 6hr x 3)     Status: Abnormal   Collection Time: 07/26/14  5:00 AM  Result Value Ref Range   Troponin I 0.22 (H) <0.031 ng/mL    Comment:        PERSISTENTLY INCREASED TROPONIN VALUES IN THE RANGE OF 0.04-0.49 ng/mL CAN BE SEEN IN:       -UNSTABLE ANGINA       -CONGESTIVE HEART FAILURE       -MYOCARDITIS       -CHEST TRAUMA       -ARRYHTHMIAS       -LATE PRESENTING MYOCARDIAL INFARCTION       -  COPD   CLINICAL FOLLOW-UP RECOMMENDED.   Brain natriuretic peptide     Status: Abnormal   Collection Time: 07/26/14  5:00 AM  Result Value Ref Range   B Natriuretic Peptide 746.8 (H) 0.0 - 100.0 pg/mL  Comprehensive metabolic panel     Status: Abnormal   Collection Time: 07/26/14  5:00 AM  Result Value Ref Range   Sodium 142 135 - 145 mmol/L   Potassium 3.3 (L) 3.5 - 5.1 mmol/L    Comment: DELTA CHECK NOTED   Chloride 105 96 - 112 mmol/L    Comment: DELTA CHECK NOTED   CO2 26 19 - 32 mmol/L   Glucose, Bld 191 (H) 70 - 99 mg/dL   BUN 41 (H) 6 - 23 mg/dL   Creatinine, Ser 2.72 (H) 0.50 - 1.35 mg/dL   Calcium 7.5 (L) 8.4 - 10.5 mg/dL   Total Protein 6.1 6.0 - 8.3 g/dL   Albumin 3.0 (L) 3.5 - 5.2 g/dL   AST 49 (H) 0 - 37 U/L   ALT 19 0 - 53 U/L   Alkaline Phosphatase 63 39 - 117 U/L   Total Bilirubin 2.8 (H) 0.3 - 1.2 mg/dL   GFR calc non Af Amer 19 (L) >90 mL/min   GFR calc Af Amer 21 (L) >90 mL/min    Comment: (NOTE) The eGFR has been calculated using the CKD EPI equation. This calculation has not been validated in all clinical  situations. eGFR's persistently <90 mL/min signify possible Chronic Kidney Disease.    Anion gap 11 5 - 15  CBC     Status: Abnormal   Collection Time: 07/26/14  5:00 AM  Result Value Ref Range   WBC 11.9 (H) 4.0 - 10.5 K/uL   RBC 4.34 4.22 - 5.81 MIL/uL   Hemoglobin 13.8 13.0 - 17.0 g/dL   HCT 41.8 39.0 - 52.0 %   MCV 96.3 78.0 - 100.0 fL   MCH 31.8 26.0 - 34.0 pg   MCHC 33.0 30.0 - 36.0 g/dL   RDW 13.4 11.5 - 15.5 %   Platelets 198 150 - 400 K/uL  Lipid panel     Status: Abnormal   Collection Time: 07/26/14  5:00 AM  Result Value Ref Range   Cholesterol 126 0 - 200 mg/dL   Triglycerides 156 (H) <150 mg/dL   HDL 29 (L) >39 mg/dL   Total CHOL/HDL Ratio 4.3 RATIO   VLDL 31 0 - 40 mg/dL   LDL Cholesterol 66 0 - 99 mg/dL    Comment:        Total Cholesterol/HDL:CHD Risk Coronary Heart Disease Risk Table                     Men   Women  1/2 Average Risk   3.4   3.3  Average Risk       5.0   4.4  2 X Average Risk   9.6   7.1  3 X Average Risk  23.4   11.0        Use the calculated Patient Ratio above and the CHD Risk Table to determine the patient's CHD Risk.        ATP III CLASSIFICATION (LDL):  <100     mg/dL   Optimal  100-129  mg/dL   Near or Above                    Optimal  130-159  mg/dL   Borderline  160-189  mg/dL   High  >190     mg/dL   Very High   CBG monitoring, ED     Status: Abnormal   Collection Time: 07/26/14  8:49 AM  Result Value Ref Range   Glucose-Capillary 174 (H) 70 - 99 mg/dL  CBG monitoring, ED     Status: Abnormal   Collection Time: 07/26/14 11:39 AM  Result Value Ref Range   Glucose-Capillary 189 (H) 70 - 99 mg/dL    Ct Abdomen Pelvis Wo Contrast  07/25/2014   CLINICAL DATA:  Abdominal pain  EXAM: CT ABDOMEN AND PELVIS WITHOUT CONTRAST  TECHNIQUE: Multidetector CT imaging of the abdomen and pelvis was performed following the standard protocol without IV contrast.  COMPARISON:  None.  FINDINGS: Lung bases shows probable chronic mild  interstitial prominence. Small granulomas are noted right lower lobe the largest calcified granuloma right base anteriorly measures 7.6 mm.  Sagittal images of the spine shows mild degenerative changes. Diffuse osteopenia. Unenhanced liver shows no biliary ductal dilatation. There is a large calcified gallstone within gallbladder measures 2 cm. Mild thickening of gallbladder wall. No pericholecystic fluid. Further correlation with gallbladder ultrasound could be performed as clinically warranted.  Atherosclerotic calcifications of abdominal aorta bilateral renal artery. Left renal artery stents are noted. Atherosclerotic calcifications of splenic artery. There is focal aneurysmal dilatation of distal abdominal aorta measures 2.6 by 2.7 cm. Atherosclerotic calcifications of common iliac arteries. Mild bilateral renal atrophy. Nonobstructive punctate calculus in upper pole of the right kidney measures 2 mm.  No hydronephrosis or hydroureter.  No calcified ureteral calculi.  No small bowel obstruction.  No free abdominal air.  No adenopathy.  Moderate stool noted in distal colon. Small amount of pelvic free fluid noted posterior pelvis. Mild enlarged prostate gland with indentation of urinary bladder base. Prostate gland measures 4.2 x 5.7 cm.  No pericecal inflammation.  IMPRESSION: 1. There is right nonobstructive nephrolithiasis. No hydronephrosis or hydroureter. No calcified ureteral calculi. 2. There is a calcified gallstone within gallbladder measures 2 cm. Mild thickening of gallbladder wall without pericholecystic fluid. Further correlation with gallbladder ultrasound could be performed as clinically warranted. 3. Mild focal aneurysmal dilatation of distal abdominal aorta measures 2.7 x 2.6 cm. 4. No small bowel obstruction. 5. There is an enlarged prostate gland with indentation of urinary bladder base.   Electronically Signed   By: Lahoma Crocker M.D.   On: 07/25/2014 21:57   Dg Chest 2 View  07/25/2014    CLINICAL DATA:  Dizziness for several months. Chest pain. Bradycardia.  EXAM: CHEST  2 VIEW  COMPARISON:  None.  FINDINGS: Cardiomegaly. Hyperinflation. Dual lead pacer with atrial and ventricular leads, grossly unremarkable. Mild bronchitic change. Scattered granulomata. No active infiltrates or failure. No effusion or pneumothorax. Calcified tortuous aorta. Osteopenia.  IMPRESSION: Cardiomegaly.  COPD.  No active disease.   Electronically Signed   By: Rolla Flatten M.D.   On: 07/25/2014 13:55   Ct Head Wo Contrast  07/25/2014   CLINICAL DATA:  Dizziness for several months.  Confusion.  EXAM: CT HEAD WITHOUT CONTRAST  TECHNIQUE: Contiguous axial images were obtained from the base of the skull through the vertex without intravenous contrast.  COMPARISON:  None.  FINDINGS: There is no acute intracranial hemorrhage, acute infarction, or mass lesion. There is diffuse cerebral cortical atrophy with secondary ventricular dilatation consistent with the patient's age. There is minimal periventricular white matter lucency consistent with slight chronic small vessel ischemic disease.  There are few opacified right  mastoid air cells. This is not felt to be significant. Osseous structures are otherwise normal.  IMPRESSION: No acute intracranial abnormality. Age related atrophy. Minimal chronic periventricular small vessel disease.   Electronically Signed   By: Lorriane Shire M.D.   On: 07/25/2014 18:51   US Renal  07/25/2014   CLINICAL DATA:  Acute onset of renal insufficiency. Renal atrophy and stones noted on CT. Initial encounter.  EXAM: RENAL/URINARY TRACT ULTRASOUND COMPLETE  COMPARISON:  CT of the abdomen and pelvis performed earlier today at 9:37 p.m.  FINDINGS: Right Kidney:  Length: 9.5 cm. Echogenicity within normal limits. Parenchymal echogenicity is borderline normal in thickness. The known tiny right renal stone is not well characterized on ultrasound. No mass or hydronephrosis visualized.  Left Kidney:   Length: 11.6 cm. Echogenicity within normal limits. No mass or hydronephrosis visualized.  Bladder:  Appears normal for degree of bladder distention. A borderline enlarged prostate is noted, impressing on the base of the bladder.  A large stone is noted within the gallbladder. Gallbladder wall thickening is noted, measuring 5 mm. Would correlate for symptoms of cholecystitis.  IMPRESSION: 1. No evidence of hydronephrosis. Known tiny right renal stone not well characterized on ultrasound. 2. Borderline enlarged prostate, impressing on the base of the bladder. 3. Cholelithiasis. Gallbladder wall thickening measures 5 mm. Would correlate for evidence of cholecystitis.   Electronically Signed   By: Garald Balding M.D.   On: 07/25/2014 23:39   Nm Pulmonary Perf And Vent  07/25/2014   CLINICAL DATA:  81 year old male with bradycardia and shortness of Breath. Initial encounter.  EXAM: NUCLEAR MEDICINE VENTILATION - PERFUSION LUNG SCAN  TECHNIQUE: Ventilation images were obtained in multiple projections using inhaled aerosol technetium 99 M DTPA. Perfusion images were obtained in multiple projections after intravenous injection of Tc-31m MAA.  RADIOPHARMACEUTICALS:  40.0 mCi Tc-95m DTPA aerosol and 6.0 mCi Tc-46m MAA  COMPARISON:  Chest radiographs 1331 hr today.  FINDINGS: The patient's arms could not be raised for these images.  Ventilation: Mildly heterogeneous ventilation in both lungs, no discrete ventilation defect.  Perfusion: No wedge shaped peripheral perfusion defects to suggest acute pulmonary embolism.  IMPRESSION: Very low probability of acute pulmonary embolus.   Electronically Signed   By: Genevie Ann M.D.   On: 07/25/2014 16:26    Assessment/Plan 1 syncope-patient describes dizzy spells of uncertain etiology. He states they occur both with lying and standing and is not necessarily positional. No associated palpitations or chest pain. We will have his pacemaker interrogated. Watch on telemetry for 24  hours. Could consider an outpatient monitor. Given his age and overall medical condition conservative measures most likely warranted if above evaluation negative. Not clear that this is orthostatic mediated but would discontinue spironolactone. Echo is pending to assess LV function and aortic stenosis. However he is not a candidate for aggressive cardiac evaluation or therapies. 2 elevated troponin-the H&P states the patient had chest pain. The patient tells me he has never had chest pain. There is no clear trend up for his enzymes. He is not a candidate for aggressive cardiac evaluation. No further ischemia workup indicated. 3 coronary artery disease-continue aspirin and statin. 4 history of atrial fibrillation-continue aspirin. CHADSvasc 5. I would not anticoagulate given recurrent falls and blindness. Continue beta blocker. Discontinue digoxin given significant renal insufficiency. 5 history of lower extremity edema-continue present dose of Lasix. 6 ? SIRS-this and other issues per primary care.  Kirk Ruths MD 07/26/2014, 11:41 AM

## 2014-07-27 DIAGNOSIS — R7989 Other specified abnormal findings of blood chemistry: Secondary | ICD-10-CM

## 2014-07-27 DIAGNOSIS — R55 Syncope and collapse: Secondary | ICD-10-CM

## 2014-07-27 DIAGNOSIS — R778 Other specified abnormalities of plasma proteins: Secondary | ICD-10-CM | POA: Insufficient documentation

## 2014-07-27 DIAGNOSIS — E785 Hyperlipidemia, unspecified: Secondary | ICD-10-CM

## 2014-07-27 DIAGNOSIS — E1165 Type 2 diabetes mellitus with hyperglycemia: Secondary | ICD-10-CM

## 2014-07-27 DIAGNOSIS — E872 Acidosis, unspecified: Secondary | ICD-10-CM | POA: Insufficient documentation

## 2014-07-27 DIAGNOSIS — I482 Chronic atrial fibrillation, unspecified: Secondary | ICD-10-CM | POA: Insufficient documentation

## 2014-07-27 DIAGNOSIS — E876 Hypokalemia: Secondary | ICD-10-CM | POA: Insufficient documentation

## 2014-07-27 DIAGNOSIS — IMO0002 Reserved for concepts with insufficient information to code with codable children: Secondary | ICD-10-CM | POA: Insufficient documentation

## 2014-07-27 DIAGNOSIS — N189 Chronic kidney disease, unspecified: Secondary | ICD-10-CM | POA: Insufficient documentation

## 2014-07-27 DIAGNOSIS — N179 Acute kidney failure, unspecified: Secondary | ICD-10-CM

## 2014-07-27 LAB — GLUCOSE, CAPILLARY
GLUCOSE-CAPILLARY: 191 mg/dL — AB (ref 70–99)
Glucose-Capillary: 154 mg/dL — ABNORMAL HIGH (ref 70–99)
Glucose-Capillary: 166 mg/dL — ABNORMAL HIGH (ref 70–99)
Glucose-Capillary: 202 mg/dL — ABNORMAL HIGH (ref 70–99)

## 2014-07-27 LAB — COMPREHENSIVE METABOLIC PANEL
ALT: 26 U/L (ref 0–53)
AST: 53 U/L — ABNORMAL HIGH (ref 0–37)
Albumin: 3 g/dL — ABNORMAL LOW (ref 3.5–5.2)
Alkaline Phosphatase: 71 U/L (ref 39–117)
Anion gap: 12 (ref 5–15)
BILIRUBIN TOTAL: 1.6 mg/dL — AB (ref 0.3–1.2)
BUN: 32 mg/dL — ABNORMAL HIGH (ref 6–23)
CALCIUM: 7.7 mg/dL — AB (ref 8.4–10.5)
CO2: 22 mmol/L (ref 19–32)
Chloride: 112 mmol/L (ref 96–112)
Creatinine, Ser: 1.87 mg/dL — ABNORMAL HIGH (ref 0.50–1.35)
GFR calc non Af Amer: 29 mL/min — ABNORMAL LOW (ref >90)
GFR, EST AFRICAN AMERICAN: 34 mL/min — AB (ref >90)
GLUCOSE: 171 mg/dL — AB (ref 70–99)
Potassium: 3.8 mmol/L (ref 3.5–5.1)
Sodium: 146 mmol/L — ABNORMAL HIGH (ref 135–145)
Total Protein: 6.3 g/dL (ref 6.0–8.3)

## 2014-07-27 LAB — HEMOGLOBIN A1C
Hgb A1c MFr Bld: 7.8 % — ABNORMAL HIGH (ref 4.8–5.6)
Mean Plasma Glucose: 177 mg/dL

## 2014-07-27 LAB — URINE CULTURE
Colony Count: NO GROWTH
Culture: NO GROWTH

## 2014-07-27 LAB — CBC
HCT: 41.2 % (ref 39.0–52.0)
Hemoglobin: 13.7 g/dL (ref 13.0–17.0)
MCH: 32.3 pg (ref 26.0–34.0)
MCHC: 33.3 g/dL (ref 30.0–36.0)
MCV: 97.2 fL (ref 78.0–100.0)
PLATELETS: 197 10*3/uL (ref 150–400)
RBC: 4.24 MIL/uL (ref 4.22–5.81)
RDW: 14.5 % (ref 11.5–15.5)
WBC: 14.8 10*3/uL — AB (ref 4.0–10.5)

## 2014-07-27 LAB — UREA NITROGEN, URINE: UREA NITROGEN UR: 240 mg/dL

## 2014-07-27 LAB — MAGNESIUM: MAGNESIUM: 1.8 mg/dL (ref 1.5–2.5)

## 2014-07-27 MED ORDER — GLUCERNA SHAKE PO LIQD: 237.0000 mL | ORAL | Status: DC

## 2014-07-27 NOTE — Care Management Note (Unsigned)
    Page 1 of 1   07/27/2014     2:56:34 PM CARE MANAGEMENT NOTE 07/27/2014  Patient:  Charles Ramsey,Charles Ramsey   Account Number:  [id number]  Date Initiated:  07/27/2014  Documentation initiated by:  Gae GallopOLE,ANGELA  Subjective/Objective Assessment:   PTA from home alone admitted with sepsis. Pt legally blind states has good support  with daughter, [name].     Action/Plan:   Return to home when medically stable. CM to f/u with discharge needs.   Anticipated DC Date:     Anticipated DC Plan:        DC Planning Services  CM consult      Choice offered to / List presented to:             Status of service:  In process, will continue to follow Medicare Important Message given?  NA - LOS <3 / Initial given by admissions (If response is "NO", the following Medicare IM given date fields will be blank) Date Medicare IM given:   Medicare IM given by:   Date Additional Medicare IM given:   Additional Medicare IM given by:    Discharge Disposition:    Per UR Regulation:  Reviewed for med. necessity/level of care/duration of stay  If discussed at Long Length of Stay Meetings, dates discussed:    Comments:

## 2014-07-27 NOTE — Progress Notes (Signed)
[place] TEAM 1 - Stepdown/ICU TEAM Progress Note  Charles Ramsey ION:[id number] DOB: 10/17/1917 DOA: 07/25/2014 PCP: Charles Ramsey  Admit HPI / Brief Narrative: 81 y.o. WM PMHx hypertension, pacemaker placement, coronary artery disease, peripheral vascular disease, chronic kidney disease, and atrial fibrillation not on anticoagulation who presented with c/o dizziness, shortness of breath, syncope, and AMS.  Patient lives in [facility]. It seems the patient had been dizzy for several weeks. His home helper was concerned about him and called EMS. Upon arrival to ED, patient briefly complained of chest pain and then syncopized. After several seconds, he returned to his baseline.   In ED, chest x-ray showed COPD changes without infiltrate. Urinalysis was negative. Troponin negative. WBC 9.7. Intermittently tachycardia, lactate 4.26. Temperature 98.5, oxygen saturation decreased to 84% which improved with nasal cannula oxygen. Because of tachypnea, ABG was done, which showed pH 7.791, PCO2 14, PO2 133, bicarbonate 21.3. VQ scan was negative for pulmonary embolism. Cre 2.88 (not clear about baseline, but patient carries a diagnosis of chronic kidney disease). CT head was negative for acute abnormalities.    HPI/Subjective: 3/31  A/O 4, states works out 3 times a week at his independent living facility, does not require any assistance devices for ambulation. States for cause exactly what happened, became dizzy at his daughter's house fell did not strike his head or have LOC.  Assessment/Plan: Syncope and collapse- dizzyness -Resolved patient feels secondary to having ~3 days of poor intake. -Echocardiogram pending -Electrophysiology interrogated pacer; no changes recommended   Lactic acidosis  -Most likely due to simple DH and hypoperfusion - no evidence of sepsis or SIRS presently  -Continue gentle hydration with saline 7960ml/hr  Elevated troponin - Hx  of CAD  Acute renal failure on CKD -No hydronephrosis on renal US - crt improving w/ hydration  - baseline Cr 1.29 June 2013 -Improving with hydration  -Continue to gently hydrate normal saline at 60 ml/hr   Mild hypokalemia -Resolved continue to follow   AoS -Echocardiogram pending, could be source of syncope, in setting of apparent DH   HTN -BP stable   DM type 2 uncontrolled -3/20 hemoglobin A1c = 7.8  -Would not try to further control his CBGs. Recent studies showing that too tight control in the elderly actually increases episodes of falls secondary to mild hypoglycemia.    HLD -Borderline for ADA guidelines. Again would not make changes to patient's medications at this point  Chronic Afib  -CHADSvasc 5 - clearly not a good candidate for anticoag given syncope and dizziness w/ advanced age - HR controlled - stopping digoxin given renal insuff and advanced age    Code Status: FULL Family Communication: Daughter present at time of exam Disposition Plan: Discharge in a.m. if stable    Consultants: Dr.Michael Excell Seltzerooper (cardiology) Dr.James Allred (electrophysiology)   Procedure/Significant Events:    Culture  MRSA by PCR negative  Antibiotics: Zosyn 3/29 >> stopped 3/30 Vanc 3/29>>.3/30   DVT prophylaxis: SQ heparin   Devices Pacer present left chest wall   LINES / TUBES:      Continuous Infusions: . sodium chloride 60 mL/hr at 07/26/14 2253    Objective: VITAL SIGNS: Temp: 97.7 F (36.5 C) (03/31 1130) Temp Source: Oral (03/31 1130) BP: 148/64 mmHg (03/31 1050) Pulse Rate: 84 (03/31 1050) SPO2; FIO2:   Intake/Output Summary (Last 24 hours) at 07/27/14 1515 Last data filed at 07/27/14 0514  Gross per 24 hour  Intake    245 ml  Output    150 ml  Net     95 ml     Exam: General: A/O 4, NAD, No acute respiratory distress Lungs: Clear to auscultation bilaterally without wheezes or crackles Cardiovascular: Irregular irregular  rhythm and rate, without murmur gallop or rub normal S1 and S2 Abdomen: Nontender, nondistended, soft, bowel sounds positive, no rebound, no ascites, no appreciable mass Extremities: No significant cyanosis, clubbing, or edema bilateral lower extremities  Data Reviewed: Basic Metabolic Panel:  Recent Labs Lab 07/25/14 1240 07/26/14 0500 07/27/14 0832  NA 137 142 146*  K 4.0 3.3* 3.8  CL 93* 105 112  CO2 GLUCOSE 178* 191* 171*  BUN 48* 41* 32*  CREATININE 2.88* 2.72* 1.87*  CALCIUM 9.2 7.5* 7.7*  MG  --   --  1.8   Liver Function Tests:  Recent Labs Lab 07/26/14 0500 07/27/14 0832  AST 49* 53*  ALT 19 26  ALKPHOS 63 71  BILITOT 2.8* 1.6*  PROT 6.1 6.3  ALBUMIN 3.0* 3.0*    Recent Labs Lab 07/25/14 2055  LIPASE 40   No results for input(s): AMMONIA in the last 168 hours. CBC:  Recent Labs Lab 07/25/14 1240 07/26/14 0500 07/27/14 0315  WBC 9.7 11.9* 14.8*  HGB 15.0 13.8 13.7  HCT 44.6 41.8 41.2  MCV 95.3 96.3 97.2  PLT 213 198 197   Cardiac Enzymes:  Recent Labs Lab 07/26/14 0006 07/26/14 0500 07/26/14 1033  CKTOTAL 306*  --   --   CKMB 9.1*  --   --   TROPONINI 0.13* 0.22* 0.21*   BNP (last 3 results)  Recent Labs  07/26/14 0500  BNP 746.8*    ProBNP (last 3 results) No results for input(s): PROBNP in the last 8760 hours.  CBG:  Recent Labs Lab 07/26/14 1139 07/26/14 1238 07/26/14 2121 07/27/14 0804 07/27/14 1126  GLUCAP 189* 184* 157* 154* 191*    Recent Results (from the past 240 hour(s))  Blood culture (routine x 2)     Status: None (Preliminary result)   Collection Time: 07/25/14  5:47 PM  Result Value Ref Range Status   Specimen Description BLOOD RIGHT WRIST  Final   Special Requests BOTTLES DRAWN AEROBIC AND ANAEROBIC 4CC  Final   Culture   Final           BLOOD CULTURE RECEIVED NO GROWTH TO DATE CULTURE WILL BE HELD FOR 5 DAYS BEFORE ISSUING A FINAL NEGATIVE REPORT Performed at [facility]     Report Status PENDING  Incomplete  Blood culture (routine x 2)     Status: None (Preliminary result)   Collection Time: 07/25/14  5:53 PM  Result Value Ref Range Status   Specimen Description BLOOD RIGHT ARM  Final   Special Requests BOTTLES DRAWN AEROBIC ONLY 5CC  Final   Culture   Final           BLOOD CULTURE RECEIVED NO GROWTH TO DATE CULTURE WILL BE HELD FOR 5 DAYS BEFORE ISSUING A FINAL NEGATIVE REPORT Performed at [facility]    Report Status PENDING  Incomplete  Urine culture     Status: None   Collection Time: 07/25/14  6:28 PM  Result Value Ref Range Status   Specimen Description URINE, RANDOM  Final   Special Requests ADDED [id number]  Final   Colony Count NO GROWTH Performed at [facility]   Final   Culture NO GROWTH Performed at [facility]  Final   Report Status 07/27/2014 FINAL  Final  MRSA PCR Screening     Status: None   Collection Time: 07/26/14 12:30 PM  Result Value Ref Range Status   MRSA by PCR NEGATIVE NEGATIVE Final    Comment:        The GeneXpert MRSA Assay (FDA approved for NASAL specimens only), is one component of a comprehensive MRSA colonization surveillance program. It is not intended to diagnose MRSA infection nor to guide or monitor treatment for MRSA infections.      Studies:  Recent x-ray studies have been reviewed in detail by the Attending Physician  Scheduled Meds:  Scheduled Meds: . aspirin  81 mg Oral Daily  . calcium carbonate  1 tablet Oral Daily  . calcium-vitamin D  1 tablet Oral Q supper  . cholecalciferol  1,000 Units Oral Daily  . clopidogrel  75 mg Oral Q breakfast  . heparin  5,000 Units Subcutaneous 3 times per day  . insulin aspart  0-9 Units Subcutaneous TID WC  . metoprolol  25 mg Oral BID  . simvastatin  20 mg Oral Daily  . sodium chloride  3 mL Intravenous Q12H    Time spent on care of this patient: 40 mins   Charles Ramsey, Roselind Messier , Ramsey  [facility] Office   [phone] Pager [phone]  On-Call/Text Page:      [email or web address]      password [email or web address]  If 7PM-7AM, please contact night-coverage [email or web address] Password [email or web address] 07/27/2014, 3:15 PM   LOS: 2 days   Care during the described time interval was provided by me .  I have reviewed this patient's available data, including medical history, events of note, physical examination, radiology studies and test results as part of my evaluation  Carolyne Littles, Ramsey [phone] Pager

## 2014-07-27 NOTE — Progress Notes (Signed)
UR COMPLETED  

## 2014-07-27 NOTE — Clinical Social Work Placement (Addendum)
Clinical Social Work Department CLINICAL SOCIAL WORK PLACEMENT NOTE 07/27/2014  Patient:  Charles Ramsey,Charles Ramsey  Account Number:  [id number] Admit date:  07/25/2014  Clinical Social Worker:  Gwynne EdingerYSHEKA BIBBS, LCSWA  Date/time:  07/27/2014 11:49 AM  Clinical Social Work is seeking post-discharge placement for this patient at the following level of care:   SKILLED NURSING   (*CSW will update this form in Epic as items are completed)   07/27/2014  Patient/family provided with [facility] Department of Clinical Social Work's list of facilities offering this level of care within the geographic area requested by the patient (or if unable, by the patient's family).  07/27/2014  Patient/family informed of their freedom to choose among providers that offer the needed level of care, that participate in Medicare, Medicaid or managed care program needed by the patient, have an available bed and are willing to accept the patient.  07/27/2014  Patient/family informed of [facility]' ownership interest in [facility], as well as of the fact that they are under no obligation to receive care at this facility.  PASARR submitted to EDS on 07/27/2014 PASARR number received on 07/27/2014  FL2 transmitted to all facilities in geographic area requested by pt/family on  07/27/2014 FL2 transmitted to all facilities within larger geographic area on 07/27/2014  Patient informed that his/her managed care company has contracts with or will negotiate with  certain facilities, including the following:     Patient/family informed of bed offers received:    [facility] Physician recommends and patient chooses bed at    [facility] 07[facility]  Patient to be transferred to facility by ambulance Patient and family notified of transfer on 07/28/14 Name of family member notified:  Einar Gipebecca Stump, daughter  The following physician request were entered in  Epic:   Additional Comments:  Dysheka Bibbs, MSW, LCSWA [phone]

## 2014-07-27 NOTE — Progress Notes (Signed)
Patient Profile: 81 year old male with past medical history of coronary artery disease, previous pacemaker, hypertension, diabetes mellitus, atrial fibrillation, renal insufficiency for evaluation of dizziness.   Subjective: No complaints, but seems a little confused at times (RN notes this is his baseline)  Objective: Vital signs in last 24 hours: Temp:  [97.4 F (36.3 C)-97.7 F (36.5 C)] 97.5 F (36.4 C) (03/31 0804) Pulse Rate:  [69-84] 84 (03/31 1050) Resp:  [15-22] 15 (03/31 0804) BP: (126-148)/(48-72) 148/64 mmHg (03/31 1050) SpO2:  [97 %-100 %] 100 % (03/31 0804) Weight:  [124 lb 1.9 oz (56.3 kg)] 124 lb 1.9 oz (56.3 kg) (03/30 1214) Last BM Date: 07/26/14  Intake/Output from previous day: 03/30 0701 - 03/31 0700 In: 745 [P.O.:120; I.V.:625] Out: 500 [Urine:500] Intake/Output this shift:    Medications Current Facility-Administered Medications  Medication Dose Route Frequency Provider Last Rate Last Dose  . 0.9 %  sodium chloride infusion   Intravenous Continuous Lonia BloodJeffrey T McClung, MD 60 mL/hr at 07/26/14 2253    . acetaminophen (TYLENOL) tablet 650 mg  650 mg Oral Q6H PRN Lorretta HarpXilin Niu, MD       Or  . acetaminophen (TYLENOL) suppository 650 mg  650 mg Rectal Q6H PRN Lorretta HarpXilin Niu, MD      . aspirin chewable tablet 81 mg  81 mg Oral Daily Lorretta HarpXilin Niu, MD   81 mg at 07/27/14 1053  . calcium carbonate (OS-CAL - dosed in mg of elemental calcium) tablet 500 mg of elemental calcium  1 tablet Oral Daily Lorretta HarpXilin Niu, MD   500 mg of elemental calcium at 07/27/14 1048  . calcium-vitamin D (OSCAL WITH D) 500-200 MG-UNIT per tablet 1 tablet  1 tablet Oral Q supper Lorretta HarpXilin Niu, MD   1 tablet at 07/26/14 1700  . cholecalciferol (VITAMIN D) tablet 1,000 Units  1,000 Units Oral Daily Lorretta HarpXilin Niu, MD   1,000 Units at 07/27/14 1050  . clopidogrel (PLAVIX) tablet 75 mg  75 mg Oral Q breakfast Lorretta HarpXilin Niu, MD   75 mg at 07/27/14 0813  . diphenhydrAMINE (BENADRYL) capsule 25 mg  25 mg Oral PRN Lorretta HarpXilin Niu,  MD      . heparin injection 5,000 Units  5,000 Units Subcutaneous 3 times per day Lorretta HarpXilin Niu, MD   5,000 Units at 07/27/14 [phone]  . insulin aspart (novoLOG) injection 0-9 Units  0-9 Units Subcutaneous TID WC Lorretta HarpXilin Niu, MD   2 Units at 07/26/14 0930  . metoprolol tartrate (LOPRESSOR) tablet 25 mg  25 mg Oral BID Lonia BloodJeffrey T McClung, MD   25 mg at 07/27/14 1050  . morphine 2 MG/ML injection 1 mg  1 mg Intravenous Q3H PRN Lorretta HarpXilin Niu, MD      . simvastatin (ZOCOR) tablet 20 mg  20 mg Oral Daily Lorretta HarpXilin Niu, MD   20 mg at 07/27/14 1050  . sodium chloride 0.9 % injection 3 mL  3 mL Intravenous Q12H Lorretta HarpXilin Niu, MD   3 mL at 07/26/14 0413    PE: General appearance: alert, cooperative and no distress Neck: no carotid bruit and no JVD Lungs: clear to auscultation bilaterally Heart: regular rate and rhythm, S1, S2 normal, no murmur, click, rub or gallop Extremities: no LEE Pulses: 2+ and symmetric Skin: warm and dry Neurologic: dementia at baseline  Lab Results:   Recent Labs  07/25/14 1240 07/26/14 0500 07/27/14 0315  WBC 9.7 11.9* 14.8*  HGB 15.0 13.8 13.7  HCT 44.6 41.8 41.2  PLT 213 198 197   BMET  Recent  Labs  07/25/14 1240 07/26/14 0500 07/27/14 0832  NA 137 142 146*  K 4.0 3.3* 3.8  CL 93* 105 112  CO2 GLUCOSE 178* 191* 171*  BUN 48* 41* 32*  CREATININE 2.88* 2.72* 1.87*  CALCIUM 9.2 7.5* 7.7*   PT/INR No results for input(s): LABPROT, INR in the last 72 hours. Cholesterol  Recent Labs  07/26/14 0500  CHOL 126   Cardiac Panel (last 3 results)  Recent Labs  07/26/14 0006 07/26/14 0500 07/26/14 1033  CKTOTAL 306*  --   --   CKMB 9.1*  --   --   TROPONINI 0.13* 0.22* 0.21*  RELINDX 3.0*  --   --     Studies/Results: Pacemaker interrogation:  SJM Accent DR RF implanted 08/11/2012  Mode DDIR 70-120 bpm  Atrial and ventricular lead function is normal. Afib since 07/2013.  89% V paced No high ventricular rates  Normal device function   2D  Echo - pending  Assessment/Plan  Principal Problem:   Sepsis Active Problems:   Arteriosclerosis of coronary artery   Dizziness   Diabetes mellitus, type 2   Peripheral blood vessel disorder   Impaired renal function   Aortic valve stenosis   Atrial fibrillation   Syncope   Essential hypertension   Abdominal pain   Acute encephalopathy   Respiratory alkalosis   Second degree AV block  1. Dizziness: Device interrogation yesterday revealed atrial and ventricular lead function is normal. Afib since 07/2013.  89% V paced. No high ventricular rates. Normal device function. Rate on telemetry currently controlled in the 70s. No serious arhythmias captured on telemetry. BP stable. Cardiac enzymes slightly elevated with flat trend. No chest pain. 2D echo pending, however given age and overall medical condition recommend conservative measures and would avoid aggressive work-up.   2. Afib: rate controlled. CHA2DS2 VASc score is 5 but not a candidate for anticoagulation given high fall risk. Continue ASA.    LOS: 2 days    Brittainy M. Delmer Islam 07/27/2014 12:02 PM  Patient seen, examined. Available data reviewed. Agree with findings, assessment, and plan as outlined by Robbie Lis, PA-C. Evaluation as outlined above shows normal pacemaker function and no significant arrhythmia on telemetry. This very pleasant elderly gentleman has no complaints on my evaluation. Not much else to add from cardiac perspective. Will sign off but feel free to call with questions. thx  Tonny Bollman, M.D. 07/27/2014 1:36 PM

## 2014-07-27 NOTE — Progress Notes (Signed)
INITIAL NUTRITION ASSESSMENT  DOCUMENTATION CODES Per approved criteria  -Non-severe (moderate) malnutrition in the context of chronic illness  Pt meets criteria for MODERATE MALNUTRITION in the context of CHRONIC ILLNESS as evidenced by severe muscle wasting and moderate loss of subcutaneous fat mass.  INTERVENTION: Provide snacks BID Provide Glucerna Shake once daily, provides 220 kcal and 10 grams of protein Provide Magic Cup ice cream with lunch and dinner, each supplement provide 290 kcal and 9 grams of protein  NUTRITION DIAGNOSIS: Inadequate oral intake related to poor appetite and advanced age as evidenced by severe wasting and 10% weight loss.   Goal: Pt to meet >/= 90% of their estimated nutrition needs   Monitor:  PO intake, supplement acceptance, weight trend, lasb  Reason for Assessment: Malnutrition Screening Tool  81 y.o. male  Admitting Dx: Sepsis  ASSESSMENT: 81 y.o. male with a history of hypertension, pacemaker placement, coronary artery disease, peripheral vascular disease, chronic kidney disease, and atrial fibrillation not on anticoagulation who presented with c/o dizziness, shortness of breath, syncope, and AMS.  Pt states that his appetite isn't great and he wasn't eating well PTA. He reports drinking juice, water, and applesauce for lunch today.  He reports that he used to weigh 180 lbs but, he has gradually been losing weight over several years. He reports that he usually eats 5 to 7 small meals daily. He has been told he needs to gain weight but, he thinks Ensure supplements are too sweet. He is agreeable to trying [employer] Cup and Glucerna, and receiving snacks.  Labs: high sodium, elevated glucose, low calcium  Nutrition Focused Physical Exam:  Subcutaneous Fat:  Orbital Region: mild wasting Upper Arm Region: mild wasting Thoracic and Lumbar Region: moderate wasting  Muscle:  Temple Region: severe wasting Clavicle Bone Region: severe  wasting Clavicle and Acromion Bone Region: moderate wasting Scapular Bone Region: NA Dorsal Hand: severe wasting Patellar Region: moderate wasting Anterior Thigh Region: moderate wasting Posterior Calf Region: NA  Edema: none   Height: Ht Readings from Last 1 Encounters:  07/26/14 5\' 7"  (1.702 m)    Weight: Wt Readings from Last 1 Encounters:  07/26/14 124 lb 1.9 oz (56.3 kg)    Ideal Body Weight: 148 lbs  % Ideal Body Weight: 84%  Wt Readings from Last 10 Encounters:  07/26/14 124 lb 1.9 oz (56.3 kg)  10/13/13 137 lb 9.6 oz (62.415 kg)    Usual Body Weight: unknown  % Usual Body Weight: NA  BMI:  Body mass index is 19.44 kg/(m^2).  Estimated Nutritional Needs: Kcal: 1400-1600 Protein: 65-80 grams Fluid: 1.4-1.6 L/day  Skin: intact  Diet Order: Diet regular Room service appropriate?: Yes; Fluid consistency:: Thin  EDUCATION NEEDS: -No education needs identified at this time   Intake/Output Summary (Last 24 hours) at 07/27/14 1537 Last data filed at 07/27/14 0514  Gross per 24 hour  Intake    245 ml  Output    150 ml  Net     95 ml    Last BM: 3/30   Labs:   Recent Labs Lab 07/25/14 1240 07/26/14 0500 07/27/14 0832  NA 137 142 146*  K 4.0 3.3* 3.8  CL 93* 105 112  CO2 28 26 22   BUN 48* 41* 32*  CREATININE 2.88* 2.72* 1.87*  CALCIUM 9.2 7.5* 7.7*  MG  --   --  1.8  GLUCOSE 178* 191* 171*    CBG (last 3)   Recent Labs  07/26/14 2121 07/27/14 0804 07/27/14 1126  GLUCAP 157* 154* 191*    Scheduled Meds: . aspirin  81 mg Oral Daily  . calcium carbonate  1 tablet Oral Daily  . calcium-vitamin D  1 tablet Oral Q supper  . cholecalciferol  1,000 Units Oral Daily  . clopidogrel  75 mg Oral Q breakfast  . heparin  5,000 Units Subcutaneous 3 times per day  . insulin aspart  0-9 Units Subcutaneous TID WC  . metoprolol  25 mg Oral BID  . simvastatin  20 mg Oral Daily  . sodium chloride  3 mL Intravenous Q12H    Continuous  Infusions: . sodium chloride 60 mL/hr at 07/26/14 2253    Past Medical History  Diagnosis Date  . Peripheral blood vessel disorder   . BP (high blood pressure)   . Arteriosclerosis of coronary artery   . Impaired renal function   . Dizziness   . Diabetes mellitus, type 2   . Cardiac murmur   . Abnormal blood level of lithium     Past Surgical History  Procedure Laterality Date  . Cardiac pacemaker placement    . Hernia repair    . Carotid endarterectomy      Ian Malkin RD, LDN Inpatient Clinical Dietitian Pager: [phone] After Hours Pager: [phone]

## 2014-07-27 NOTE — Clinical Social Work Psychosocial (Signed)
Clinical Social Work Department BRIEF PSYCHOSOCIAL ASSESSMENT 07/27/2014  Patient:  Charles Ramsey, Charles Ramsey     Account Number:  [id number]     Admit date:  07/25/2014  Clinical Social Worker:  Marciano Sequin  Date/Time:  07/27/2014 11:45 AM  Referred by:  RN  Date Referred:  07/27/2014 Referred for  SNF Placement   Other Referral:   Interview type:  Patient Other interview type:    PSYCHOSOCIAL DATA Living Status:  FACILITY Admitted from facility:  [facility] Level of care:  Assisted Living Primary support name:  Charles Ramsey Primary support relationship to patient:  CHILD, ADULT Degree of support available:   String Support    CURRENT CONCERNS Current Concerns  None Noted   Other Concerns:    SOCIAL WORK ASSESSMENT / PLAN CSW met the pt and the pt's daughter Charles Guiles at the bedside.  CSW introduced self and purpose of the visit. Charles Guiles reported the pt is a resident at [facility]. CSW discussed clinical recommendation for SNF rehab. CSW inquired about the geographical location in which the pt would like to receive rehab from. Charles Guiles reported if [facility] cannot take the pt then she would prefer [facility].  CSW explained the SNF rehab process to the pt. CSW, pt and Charles Guiles discussed insurance and its relation to SNF rehab. CSW answered all questions in which the pt inquired about. CSW provided pt with contact information for further questions. CSW will continue to follow this pt and assist with discharge as needed.   Assessment/plan status:  Psychosocial Support/Ongoing Assessment of Needs Other assessment/ plan:   Information/referral to community resources:    PATIENT'S/FAMILY'S RESPONSE TO CURRENT DIAGNOSE: The pt presented with a happy affect and joyful mood. The pt appeared to be context with his current diagnose. The pt joke around about his old age and his life expectancy.   PATIENT'S/FAMILY'S RESPONSE TO PLAN OF CARE: Both the pt and  daughter are agreeable to SNF placement until the back can return back to his ALF.   [place], MSW, [place]

## 2014-07-28 DIAGNOSIS — E440 Moderate protein-calorie malnutrition: Secondary | ICD-10-CM | POA: Insufficient documentation

## 2014-07-28 DIAGNOSIS — R55 Syncope and collapse: Secondary | ICD-10-CM

## 2014-07-28 LAB — CBC
HEMATOCRIT: 42.1 % (ref 39.0–52.0)
Hemoglobin: 13.6 g/dL (ref 13.0–17.0)
MCH: 31.1 pg (ref 26.0–34.0)
MCHC: 32.3 g/dL (ref 30.0–36.0)
MCV: 96.3 fL (ref 78.0–100.0)
PLATELETS: 174 10*3/uL (ref 150–400)
RBC: 4.37 MIL/uL (ref 4.22–5.81)
RDW: 14.4 % (ref 11.5–15.5)
WBC: 14.4 10*3/uL — ABNORMAL HIGH (ref 4.0–10.5)

## 2014-07-28 LAB — COMPREHENSIVE METABOLIC PANEL
ALBUMIN: 2.8 g/dL — AB (ref 3.5–5.2)
ALK PHOS: 69 U/L (ref 39–117)
ALT: 27 U/L (ref 0–53)
AST: 45 U/L — AB (ref 0–37)
Anion gap: 7 (ref 5–15)
BUN: 29 mg/dL — ABNORMAL HIGH (ref 6–23)
CO2: 24 mmol/L (ref 19–32)
Calcium: 7.9 mg/dL — ABNORMAL LOW (ref 8.4–10.5)
Chloride: 113 mmol/L — ABNORMAL HIGH (ref 96–112)
Creatinine, Ser: 1.49 mg/dL — ABNORMAL HIGH (ref 0.50–1.35)
GFR calc non Af Amer: 38 mL/min — ABNORMAL LOW (ref >90)
GFR, EST AFRICAN AMERICAN: 44 mL/min — AB (ref >90)
GLUCOSE: 169 mg/dL — AB (ref 70–99)
POTASSIUM: 4.3 mmol/L (ref 3.5–5.1)
SODIUM: 144 mmol/L (ref 135–145)
TOTAL PROTEIN: 5.9 g/dL — AB (ref 6.0–8.3)
Total Bilirubin: 1.7 mg/dL — ABNORMAL HIGH (ref 0.3–1.2)

## 2014-07-28 LAB — GLUCOSE, CAPILLARY
Glucose-Capillary: 157 mg/dL — ABNORMAL HIGH (ref 70–99)
Glucose-Capillary: 160 mg/dL — ABNORMAL HIGH (ref 70–99)
Glucose-Capillary: 175 mg/dL — ABNORMAL HIGH (ref 70–99)
Glucose-Capillary: 183 mg/dL — ABNORMAL HIGH (ref 70–99)

## 2014-07-28 LAB — LACTIC ACID, PLASMA: Lactic Acid, Venous: 2.3 mmol/L (ref 0.5–2.0)

## 2014-07-28 MED ORDER — METOPROLOL TARTRATE 25 MG PO TABS: 25.0000 mg | ORAL_TABLET | Freq: Two times a day (BID) | ORAL | Status: AC

## 2014-07-28 MED ORDER — GLUCERNA SHAKE PO LIQD: 237.0000 mL | ORAL | Status: AC

## 2014-07-28 NOTE — Progress Notes (Signed)
Report called to receiving RN 6E. PT transferred in bed with all belongings. Receiving RN at bedside and updated.

## 2014-07-28 NOTE — Discharge Summary (Signed)
DISCHARGE SUMMARY  Charles Ramsey  MR#: [id number]  DOB:03/16/1918  Date of Admission: 07/25/2014 Date of Discharge: 07/28/2014  Attending Physician:Charles Ramsey  Patient's JXB:JYNWGN,FAOZPCP:Charles [name], MD  Consults: [facility] and EP   Disposition: D/C to SNF   Follow-up Appts:     Follow-up Information    Follow up with Attending of record .   Why:  The Attending of record at your chosen SNF will provide for your ongoing medical care.       Tests Needing Follow-up: -TTE is pending at time of d/c - pt is not a candidate for aggressive cardiac interventions, but results could provide prognostic information regarding degree of AoS -DM medications have been intentionally stopped due to poor intake - CBGs should be monitored and tx resumed only if pt experiences extreme hyperglycemia -PLEASE NOTE, digoxin and ALL DIURETICS have been intentionally stopped  Discharge Diagnoses: Syncope - dizzyness Lactic acidosis  Elevated troponin - Hx of CAD Acute renal failure on CKD Mild hypokalemia AoS HTN DM Chronic Afib  Moderate malnutrition in the context of chronic illness  Initial presentation: 81 y.o. male with a history of hypertension, pacemaker placement, coronary artery disease, peripheral vascular disease, chronic kidney disease, and atrial fibrillation not on anticoagulation who presented with c/o dizziness, shortness of breath, syncope, and AMS.  Patient lives in an independent living facility. It seems the patient had been dizzy for several weeks. His home helper was concerned about him and called EMS. Upon arrival to ED, patient briefly complained of chest pain and then syncopized. After several seconds, he returned to his baseline.   In ED, chest x-ray showed COPD changes without infiltrate. Urinalysis was negative. Troponin negative. WBC 9.7. Intermittently tachycardia, lactate 4.26. Temperature 98.5, oxygen saturation decreased to 84% which improved with nasal  cannula oxygen. Because of tachypnea, ABG was done, which showed pH 7.791, PCO2 14, PO2 133, bicarbonate 21.3. VQ scan was negative for pulmonary embolism. Cre 2.88 (not clear about baseline, but patient carries a diagnosis of chronic kidney disease). CT head was negative for acute abnormalities.   Hospital Course:  Syncope - dizzyness Cards has seen - pacer interrogated and functioning normally - likely due to poor intake for 3 days > DH - asymptomatic at time of d/c - care to be escalated to SNF level after d/c   Lactic acidosis  Most likely due to simple DH and hypoperfusion - no evidence of sepsis or SIRS presently - trended down w/ volume expansion  Elevated troponin - Hx of CAD not a candidate for aggressive cardiac evaluation - no further ischemia workup indicated - Cards agrees  Acute renal failure on CKD No hydronephrosis on renal US - crt improved w/ hydration - baseline crt 1.29 June 2013 - crt 1.49 at time of d/c   Mild hypokalemia Gently replaced and normalized  AoS Await TTE - could be source of syncope, in setting of apparent DH   HTN BP stable - accepting higher values in this elderly gentleman w/ AoS and recent DH / syncope   DM2  CBG not ideal - follow w/o change for now - A1c 7.8 - given advanced age will not attempt more strict control   Chronic Afib  CHADSvasc 5 - clearly not a good candidate for anticoag given syncope and dizziness w/ advanced age - HR controlled - stopped digoxin given renal insuff and advanced age   Moderate malnutrition in the context of chronic illness    Medication List    STOP taking  these medications        calcium carbonate 600 MG Tabs tablet  Commonly known as:  OS-CAL     digoxin 0.125 MG tablet  Commonly known as:  LANOXIN     furosemide 20 MG tablet  Commonly known as:  LASIX     glimepiride 2 MG tablet  Commonly known as:  AMARYL     PRESCRIPTION MEDICATION     spironolactone 25 MG tablet  Commonly known as:   ALDACTONE     VITAMIN D-3 PO      TAKE these medications        aspirin 81 MG tablet  Take 81 mg by mouth daily.     CALCIUM 1200 PO  Take 1 tablet by mouth daily.     clopidogrel 75 MG tablet  Commonly known as:  PLAVIX  Take 75 mg by mouth daily with breakfast.     diphenhydrAMINE 25 mg capsule  Commonly known as:  BENADRYL  Take 25 mg by mouth as needed.     feeding supplement (GLUCERNA SHAKE) Liqd  Take 237 mLs by mouth daily.     metoprolol tartrate 25 MG tablet  Commonly known as:  LOPRESSOR  Take 1 tablet (25 mg total) by mouth 2 (two) times daily.     simvastatin 20 MG tablet  Commonly known as:  ZOCOR  Take 20 mg by mouth daily.       Day of Discharge BP 170/76 mmHg  Pulse 82  Temp(Src) 97.7 F (36.5 C) (Oral)  Resp 19  Ht  (1.702 m)  Wt 59 kg (130 lb 1.1 oz)  BMI 20.37 kg/m2  SpO2 98%  Physical Exam: General: No acute respiratory distress - alert  Lungs: Clear to auscultation bilaterally without wheezes or crackles Cardiovascular: Regular rate and rhythm - hear sounds distant  Abdomen: Nontender, nondistended, soft, bowel sounds positive, no rebound, no ascites, no appreciable mass Extremities: No significant cyanosis, clubbing, or edema bilateral lower extremities  Basic Metabolic Panel:  Recent Labs Lab 07/25/14 1240 07/26/14 0500 07/27/14 0832 07/28/14 0530  NA 137 142 146* 144  K 4.0 3.3* 3.8 4.3  CL 93* 105 112 113*  CO2 GLUCOSE 178* 191* 171* 169*  BUN 48* 41* 32* 29*  CREATININE 2.88* 2.72* 1.87* 1.49*  CALCIUM 9.2 7.5* 7.7* 7.9*  MG  --   --  1.8  --     Liver Function Tests:  Recent Labs Lab 07/26/14 0500 07/27/14 0832 07/28/14 0530  AST 49* 53* 45*  ALT ALKPHOS 63 71 69  BILITOT 2.8* 1.6* 1.7*  PROT 6.1 6.3 5.9*  ALBUMIN 3.0* 3.0* 2.8*    Recent Labs Lab 07/25/14 2055  LIPASE 40   CBC:  Recent Labs Lab 07/25/14 1240 07/26/14 0500 07/27/14 0315 07/28/14 0530  WBC 9.7  11.9* 14.8* 14.4*  HGB 15.0 13.8 13.7 13.6  HCT 44.6 41.8 41.2 42.1  MCV 95.3 96.3 97.2 96.3  PLT 213 198 197 174    Cardiac Enzymes:  Recent Labs Lab 07/26/14 0006 07/26/14 0500 07/26/14 1033  CKTOTAL 306*  --   --   CKMB 9.1*  --   --   TROPONINI 0.13* 0.22* 0.21*   CBG:  Recent Labs Lab 07/27/14 1126 07/27/14 1603 07/27/14 1954 07/28/14 0040 07/28/14 0747  GLUCAP 191* 166* 202* 175* 160*    Recent Results (from the past 240 hour(s))  Blood culture (routine x 2)  Status: None (Preliminary result)   Collection Time: 07/25/14  5:47 PM  Result Value Ref Range Status   Specimen Description BLOOD RIGHT WRIST  Final   Special Requests BOTTLES DRAWN AEROBIC AND ANAEROBIC 4CC  Final   Culture   Final           BLOOD CULTURE RECEIVED NO GROWTH TO DATE CULTURE WILL BE HELD FOR 5 DAYS BEFORE ISSUING A FINAL NEGATIVE REPORT Performed at [facility]    Report Status PENDING  Incomplete  Blood culture (routine x 2)     Status: None (Preliminary result)   Collection Time: 07/25/14  5:53 PM  Result Value Ref Range Status   Specimen Description BLOOD RIGHT ARM  Final   Special Requests BOTTLES DRAWN AEROBIC ONLY 5CC  Final   Culture   Final           BLOOD CULTURE RECEIVED NO GROWTH TO DATE CULTURE WILL BE HELD FOR 5 DAYS BEFORE ISSUING A FINAL NEGATIVE REPORT Performed at [facility]    Report Status PENDING  Incomplete  Urine culture     Status: None   Collection Time: 07/25/14  6:28 PM  Result Value Ref Range Status   Specimen Description URINE, RANDOM  Final   Special Requests ADDED [id number]  Final   Colony Count NO GROWTH Performed at [facility]   Final   Culture NO GROWTH Performed at [facility]   Final   Report Status 07/27/2014 FINAL  Final  MRSA PCR Screening     Status: None   Collection Time: 07/26/14 12:30 PM  Result Value Ref Range Status   MRSA by PCR NEGATIVE NEGATIVE Final    Comment:        The GeneXpert  MRSA Assay (FDA approved for NASAL specimens only), is one component of a comprehensive MRSA colonization surveillance program. It is not intended to diagnose MRSA infection nor to guide or monitor treatment for MRSA infections.     Time spent in discharge (includes decision making & examination of pt): >35 minutes  07/28/2014, 9:07 AM   Lonia Blood, MD [facility] Office  [phone] Pager [phone]  On-Call/Text Page:      [email or web address]      password [facility]

## 2014-07-28 NOTE — Progress Notes (Signed)
PT Cancellation Note  Patient Details Name: Charles Ramsey MRN: [id number] DOB: 12/10/1917   Cancelled Treatment:    Reason Eval/Treat Not Completed: Pt to d/c this afternoon to SNF for further rehab. Will defer PT treatment to next venue of care. If d/c is delayed, will continue to follow acutely.    Conni SlipperKirkman, Karyss Frese 07/28/2014, 2:23 PM   Conni SlipperLaura Farha Dano, PT, DPT Acute Rehabilitation Services Pager: [phone]

## 2014-07-28 NOTE — Care Management Note (Signed)
CARE MANAGEMENT NOTE 07/28/2014  Patient:  Charles Ramsey,Charles Ramsey   Account Number:  [id number]  Date Initiated:  07/27/2014  Documentation initiated by:  COLE,ANGELA  Subjective/Objective Assessment:   PTA from home alone admitted with sepsis. Pt legally blind states has good support  with daughter, Charles Ramsey.     Action/Plan:   Return to home when medically stable. CM to f/u with discharge needs.  07/28/14 Plan to d/c today to return to SNF.   Anticipated DC Date:  07/28/2014   Anticipated DC Plan:  SKILLED NURSING FACILITY      DC Planning Services  CM consult      Choice offered to / List presented to:             Status of service:  In process, will continue to follow Medicare Important Message given?  YES (If response is "NO", the following Medicare IM given date fields will be blank) Date Medicare IM given:  07/28/2014 Medicare IM given by:  Latorie Montesano Date Additional Medicare IM given:   Additional Medicare IM given by:    Discharge Disposition:  SKILLED NURSING FACILITY  Per UR Regulation:  Reviewed for med. necessity/level of care/duration of stay  If discussed at Long Length of Stay Meetings, dates discussed:    Comments:

## 2014-07-28 NOTE — Progress Notes (Signed)
  Echocardiogram 2D Echocardiogram has been performed.  Charles Ramsey 07/28/2014, 1:27 PM

## 2014-08-01 LAB — CULTURE, BLOOD (ROUTINE X 2)
Culture: NO GROWTH
Culture: NO GROWTH

## 2014-08-27 DEATH — deceased

## 2015-04-09 IMAGING — CT CT ABD-PELV W/O CM
2 of 4 series · 12 of 46 positions shown, 14 images · non-contrast
Comparison: None.

CLINICAL DATA: Abdominal pain

EXAM:
CT ABDOMEN AND PELVIS WITHOUT CONTRAST
TECHNIQUE: Multidetector CT imaging of the abdomen and pelvis was performed
following the standard protocol without IV contrast.

[Series 201: routine, idose (2) · axial · 0.70mm/px · z∈[-436,-46]mm · 9 of 96 slices shown, 11 images]
[im 9/96  soft-tissue]
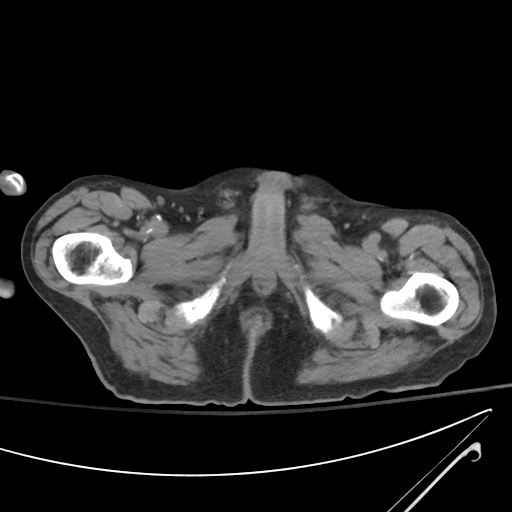
[im 9/96  bone]
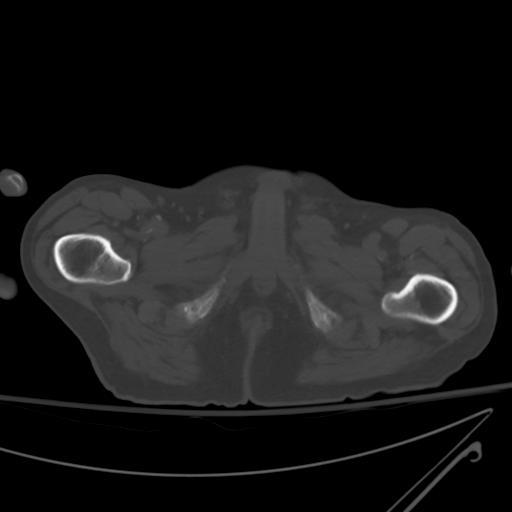
[im 17/96  soft-tissue]
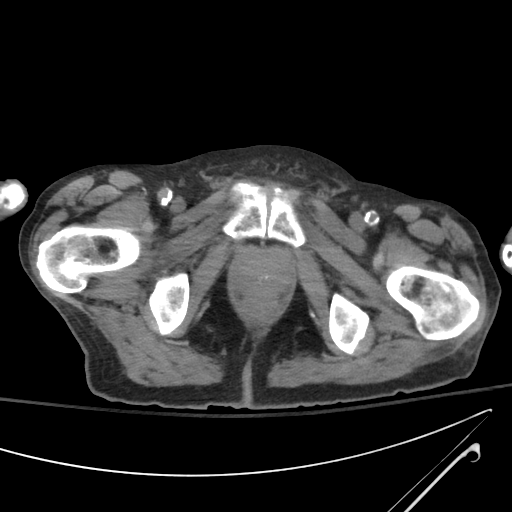
[im 29/96  soft-tissue]
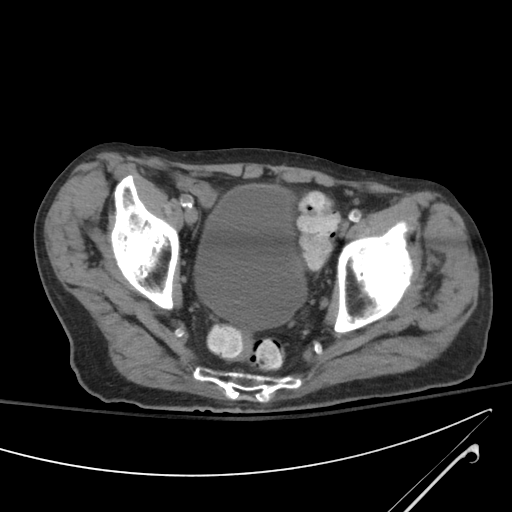
[im 38/96  soft-tissue]
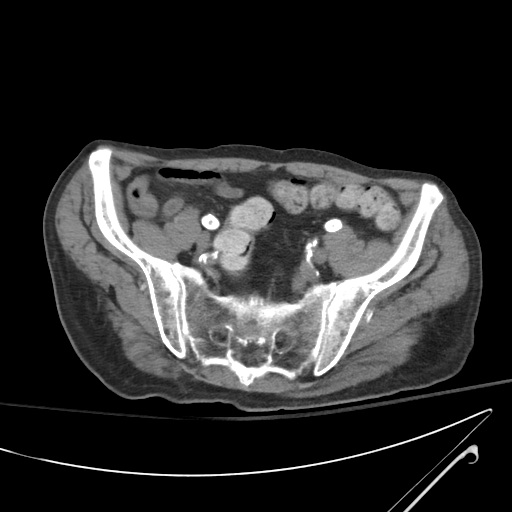
[im 50/96  soft-tissue]
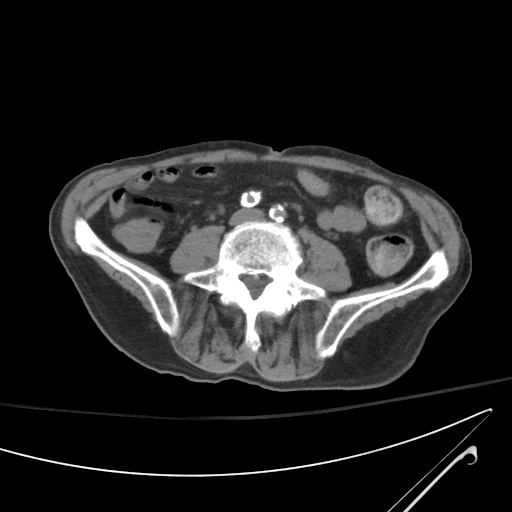
[im 58/96  soft-tissue]
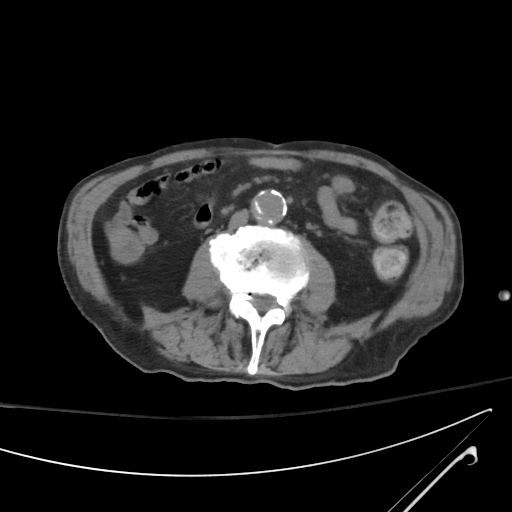
[im 67/96  soft-tissue]
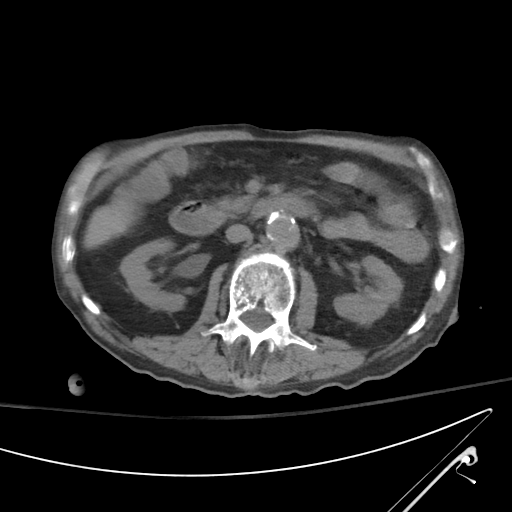
[im 79/96  soft-tissue]
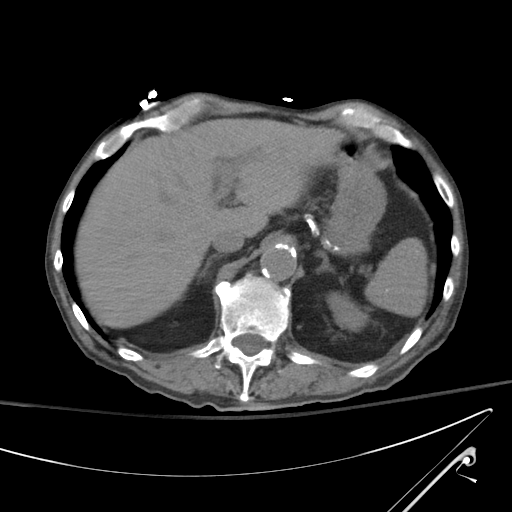
[im 87/96  soft-tissue]
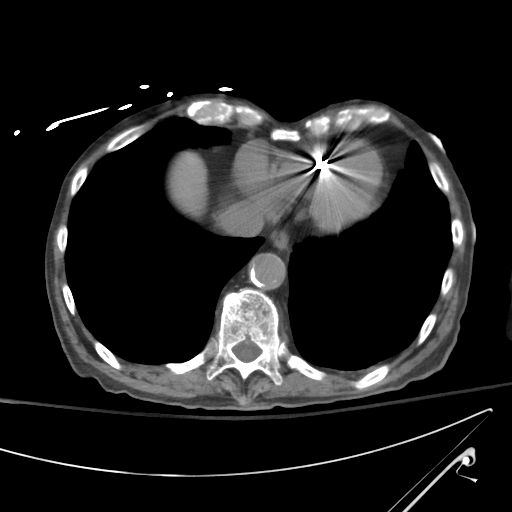
[im 87/96  bone]
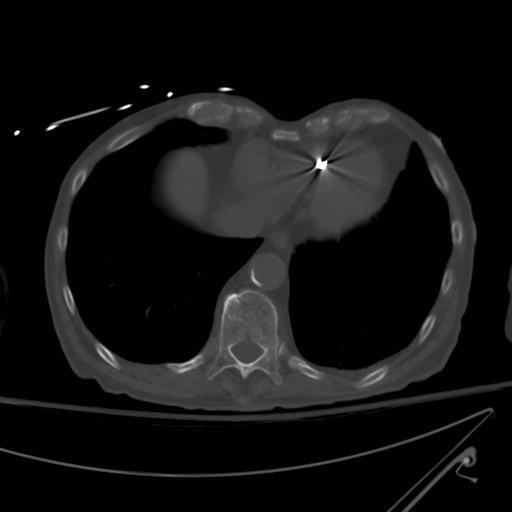

[Series 202: coronals, idose (2) · coronal · 0.50mm/px · 3 of 107 slices shown]
[im 36/107  soft-tissue]
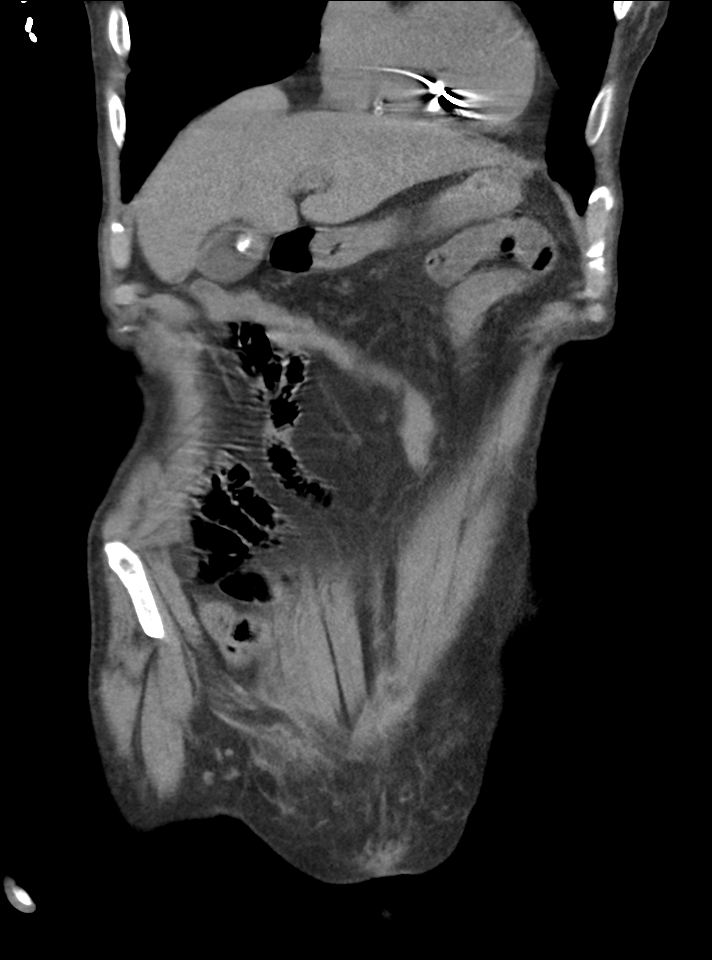
[im 48/107  soft-tissue]
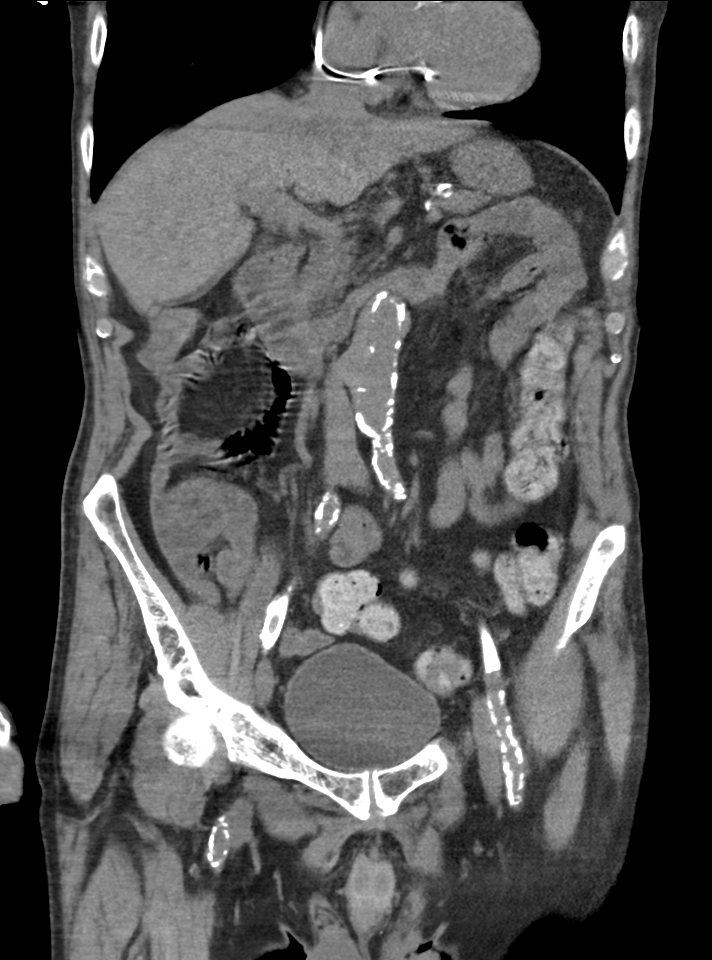
[im 59/107  soft-tissue]
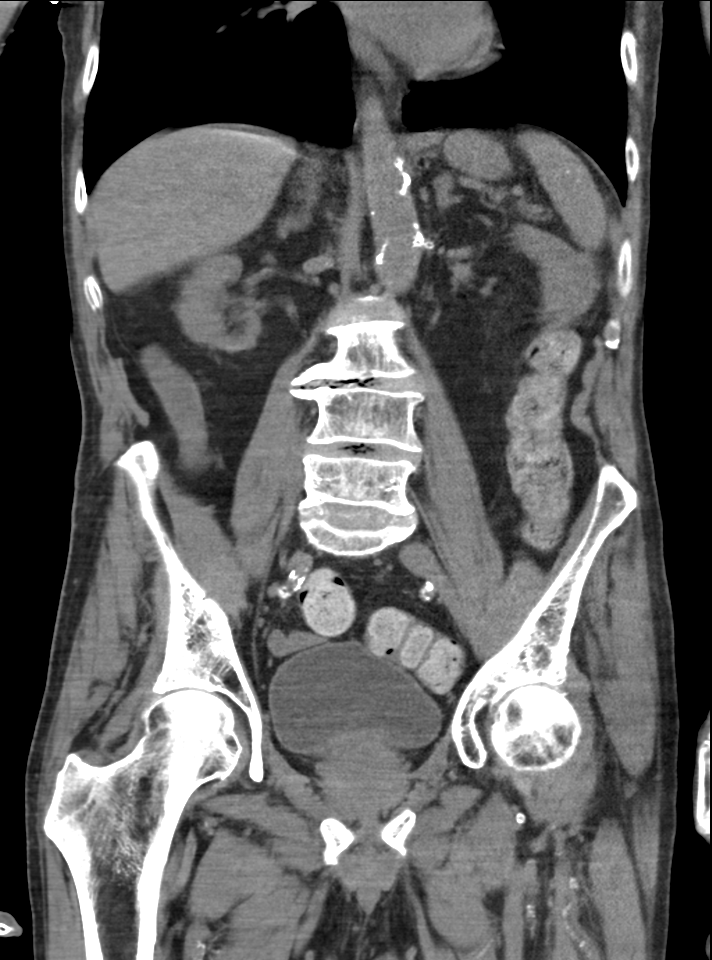

[12 of 46 positions shown; findings below may reference images not displayed]

FINDINGS: Lung bases shows probable chronic mild interstitial prominence.
Small granulomas are noted right lower lobe the largest calcified
granuloma right base anteriorly measures 7.6 mm.

Sagittal images of the spine shows mild degenerative changes.
Diffuse osteopenia. Unenhanced liver shows no biliary ductal
dilatation. There is a large calcified gallstone within gallbladder
measures 2 cm. Mild thickening of gallbladder wall. No
pericholecystic fluid. Further correlation with gallbladder
ultrasound could be performed as clinically warranted.

Atherosclerotic calcifications of abdominal aorta bilateral renal
artery. Left renal artery stents are noted. Atherosclerotic
calcifications of splenic artery. There is focal aneurysmal
dilatation of distal abdominal aorta measures 2.6 by 2.7 cm.
Atherosclerotic calcifications of common iliac arteries. Mild
bilateral renal atrophy. Nonobstructive punctate calculus in upper
pole of the right kidney measures 2 mm.

No hydronephrosis or hydroureter.  No calcified ureteral calculi.

No small bowel obstruction.  No free abdominal air.  No adenopathy.

Moderate stool noted in distal colon. Small amount of pelvic free
fluid noted posterior pelvis. Mild enlarged prostate gland with
indentation of urinary bladder base. Prostate gland measures 4.2 x
5.7 cm.

No pericecal inflammation.
IMPRESSION: 1. There is right nonobstructive nephrolithiasis. No hydronephrosis
or hydroureter. No calcified ureteral calculi.
2. There is a calcified gallstone within gallbladder measures 2 cm.
Mild thickening of gallbladder wall without pericholecystic fluid.
Further correlation with gallbladder ultrasound could be performed
as clinically warranted.
3. Mild focal aneurysmal dilatation of distal abdominal aorta
measures 2.7 x 2.6 cm.
4. No small bowel obstruction.
5. There is an enlarged prostate gland with indentation of urinary
bladder base.

## 2015-04-09 IMAGING — CR DG CHEST 2V
2 series · 2 of 2 positions shown · non-contrast
Comparison: None.

CLINICAL DATA: Dizziness for several months. Chest pain.
Bradycardia.

EXAM:
CHEST  2 VIEW

[chest lat]
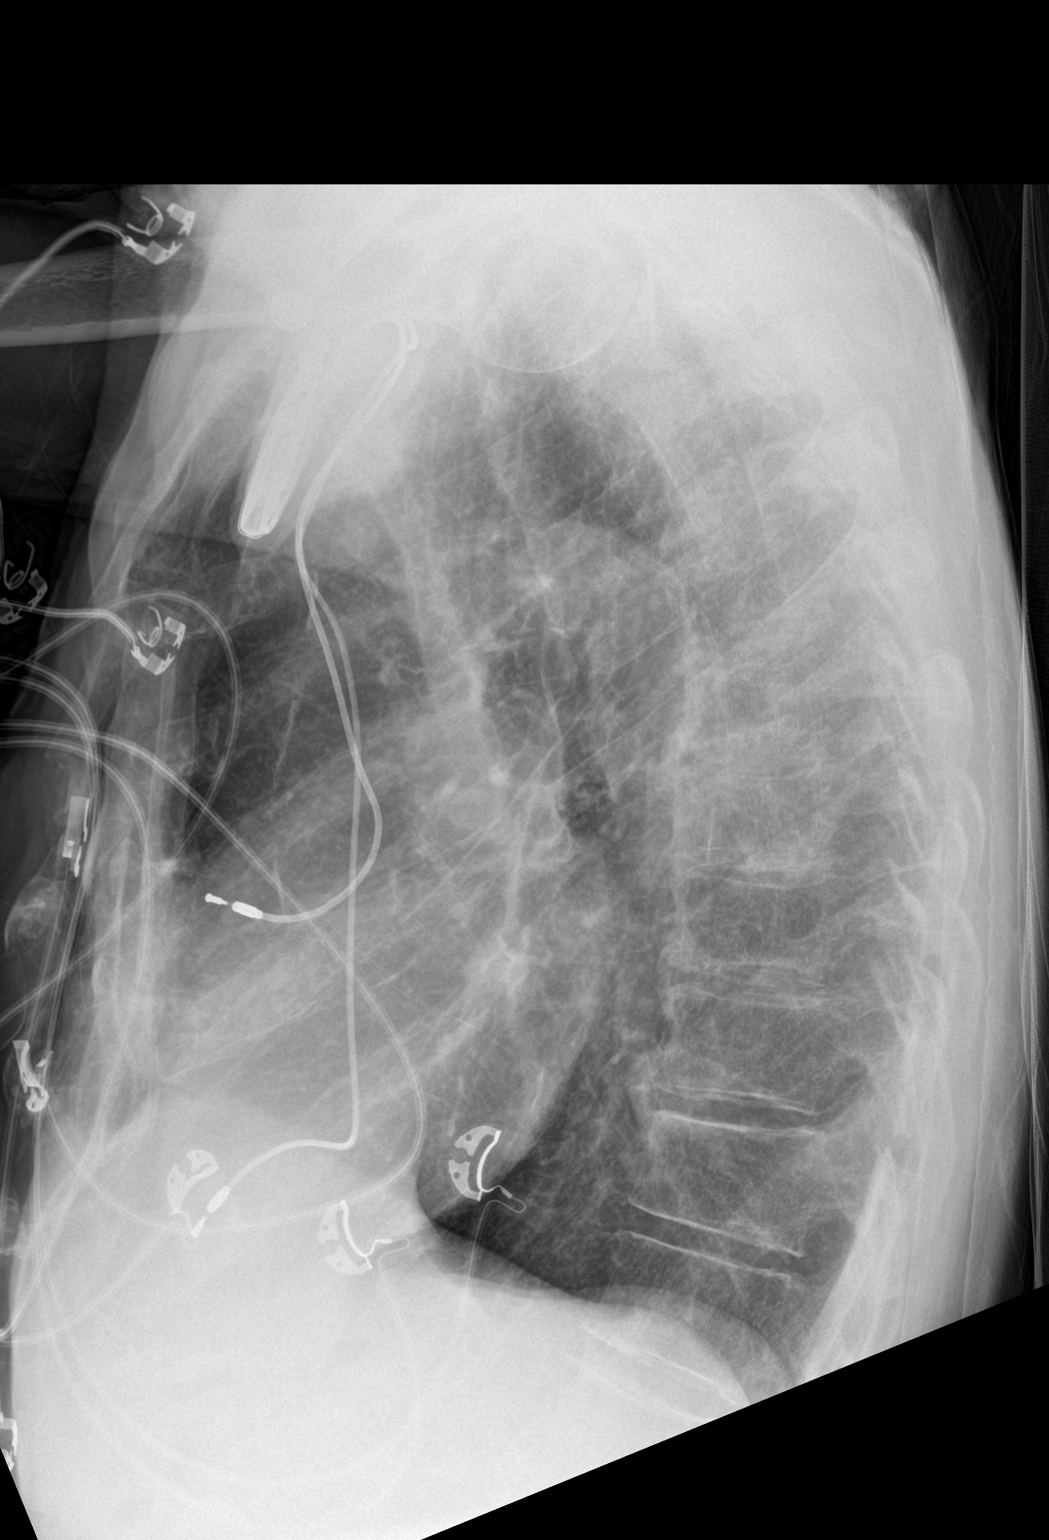

[chest ap]
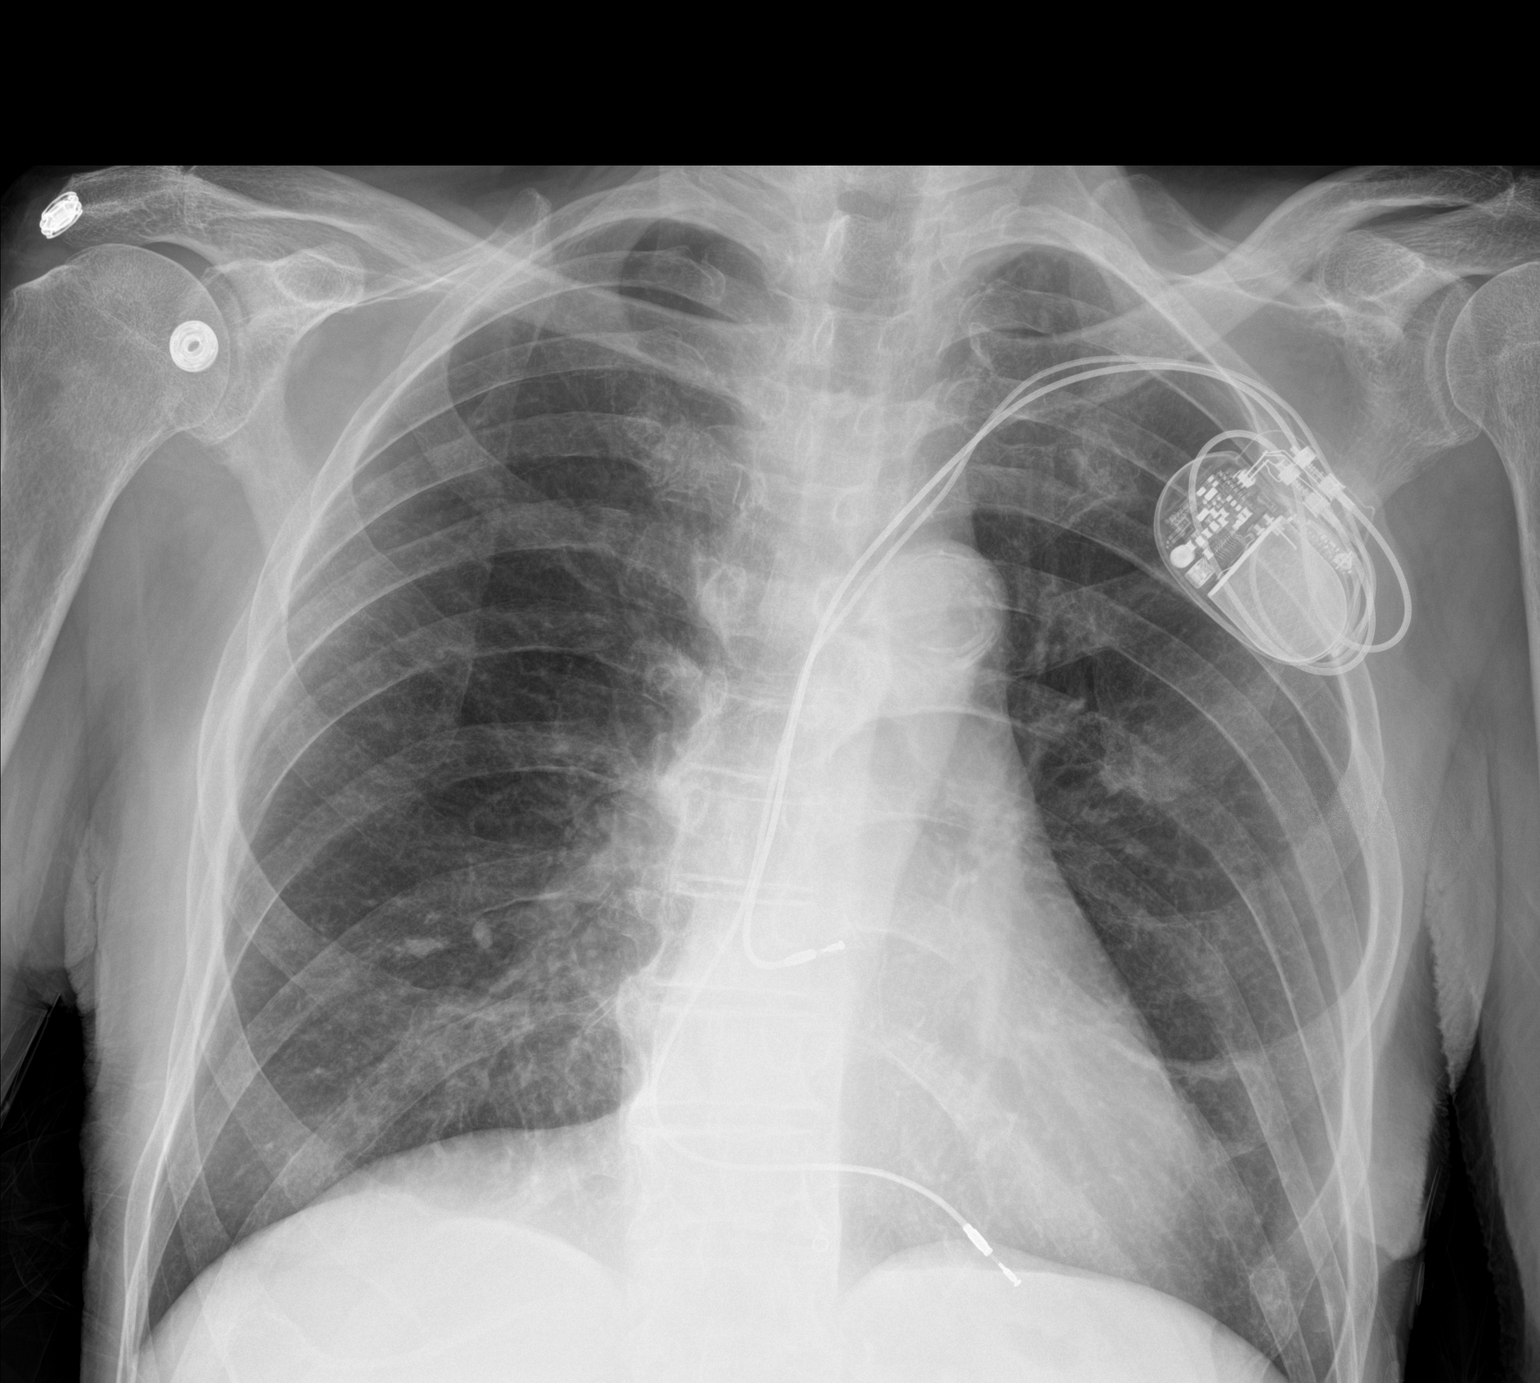

[2 of 2 positions shown; findings below may reference images not displayed]

FINDINGS: Cardiomegaly. Hyperinflation. Dual lead pacer with atrial and
ventricular leads, grossly unremarkable. Mild bronchitic change.
Scattered granulomata. No active infiltrates or failure. No effusion
or pneumothorax. Calcified tortuous aorta. Osteopenia.
IMPRESSION: Cardiomegaly.  COPD.  No active disease.

## 2015-04-09 IMAGING — CT CT HEAD W/O CM
1 of 2 series · 16 of 30 positions shown, 20 images · non-contrast
Comparison: None.

CLINICAL DATA: Dizziness for several months.  Confusion.

EXAM:
CT HEAD WITHOUT CONTRAST
TECHNIQUE: Contiguous axial images were obtained from the base of the skull
through the vertex without intravenous contrast.

[Series 3: head 2.0 h70h · axial · 0.44mm/px · z∈[-122,+30]mm · 16 of 86 slices shown, 20 images]
[im 5/86  brain]
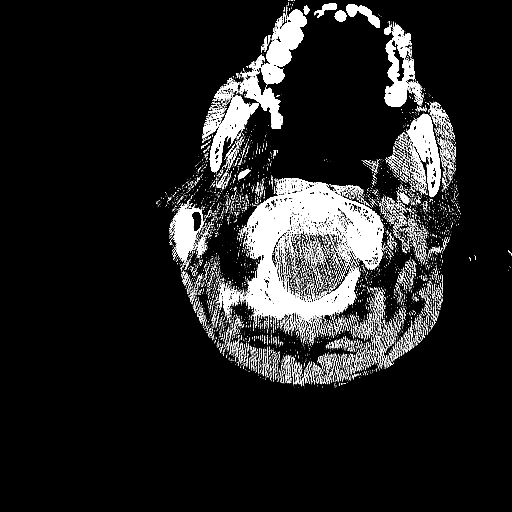
[im 5/86  bone]
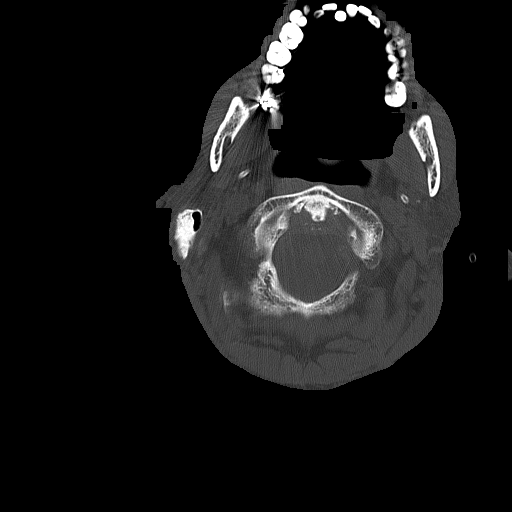
[im 9/86  brain]
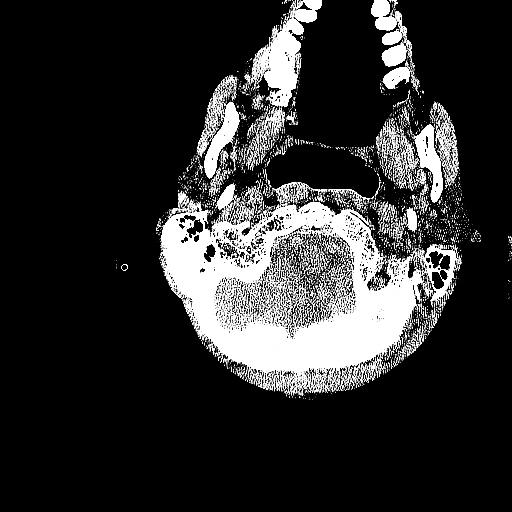
[im 17/86  brain]
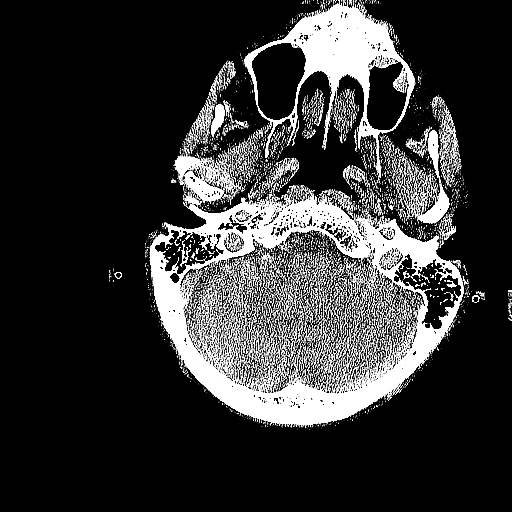
[im 21/86  brain]
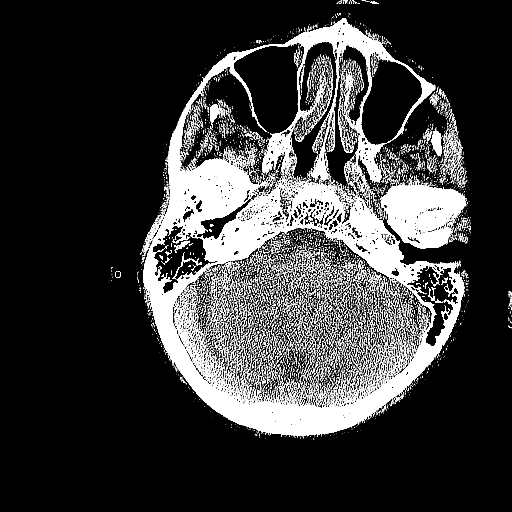
[im 25/86  brain]
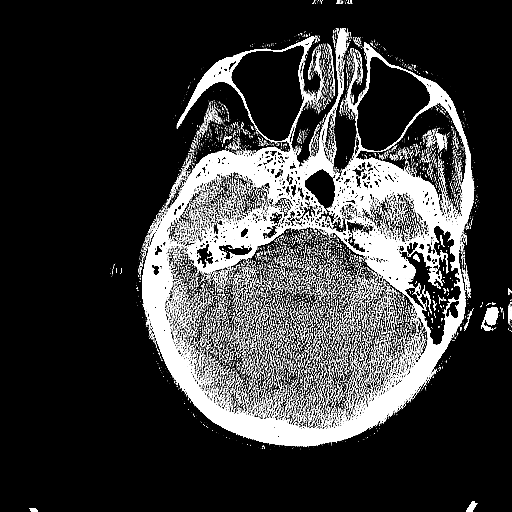
[im 25/86  bone]
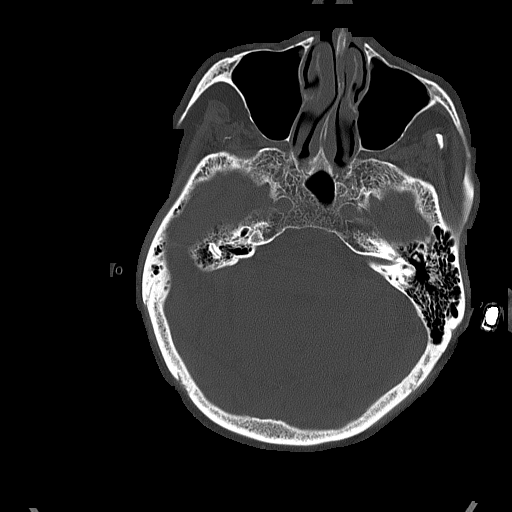
[im 29/86  brain]
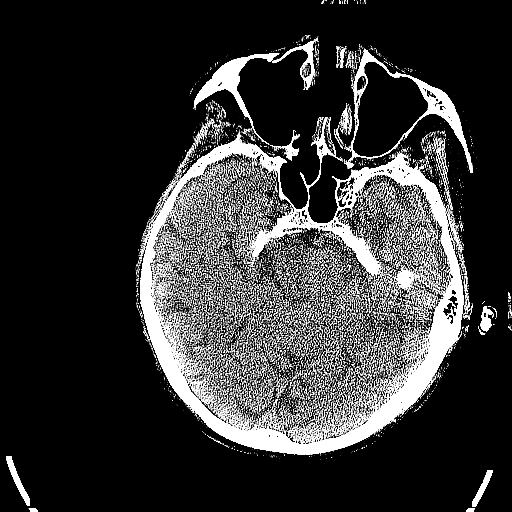
[im 37/86  brain]
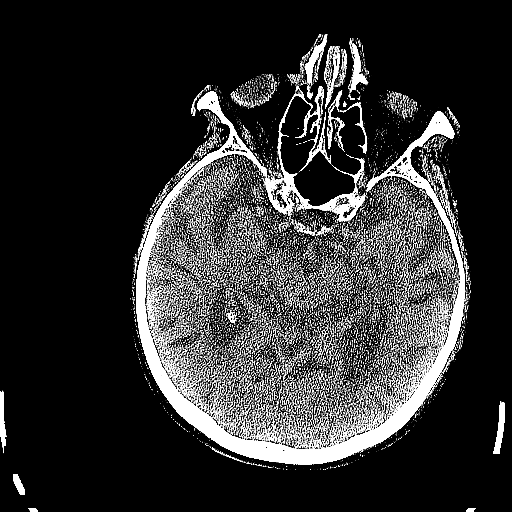
[im 41/86  brain]
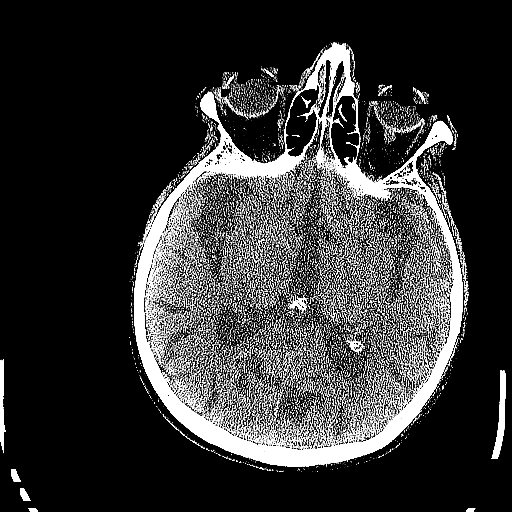
[im 45/86  brain]
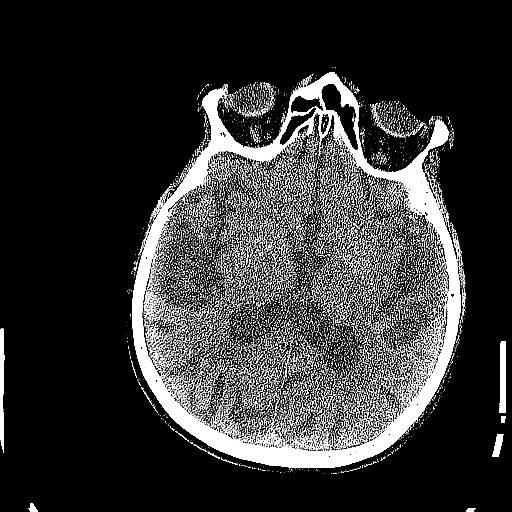
[im 45/86  bone]
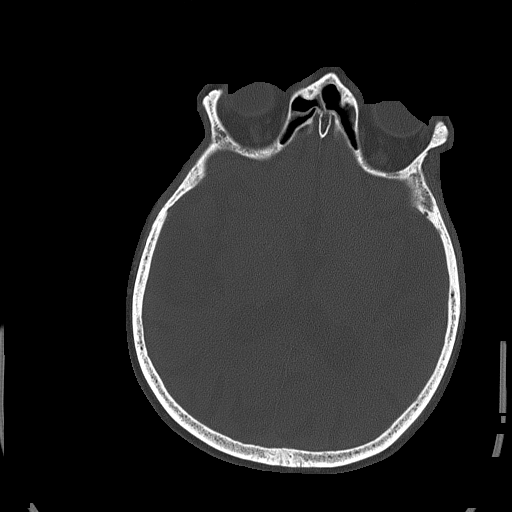
[im 49/86  brain]
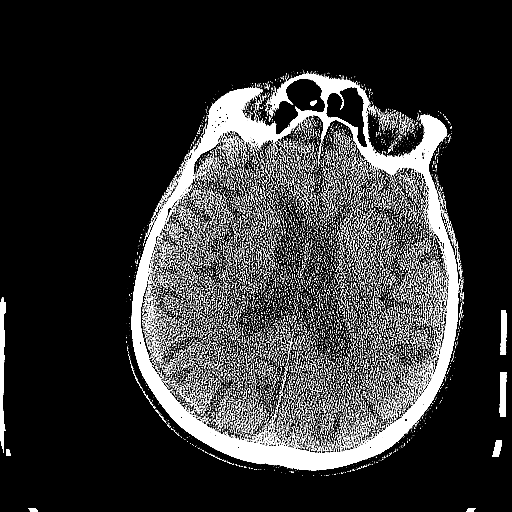
[im 57/86  brain]
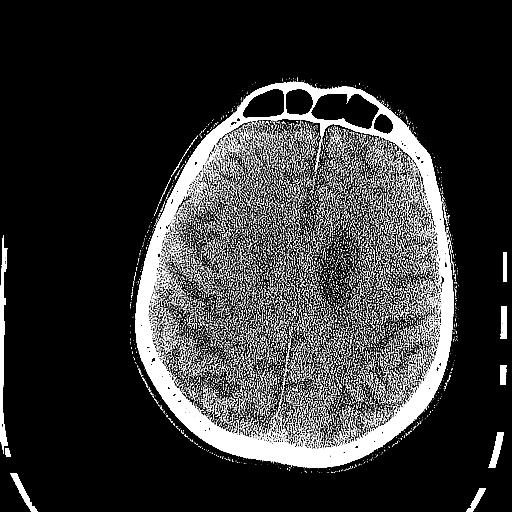
[im 61/86  brain]
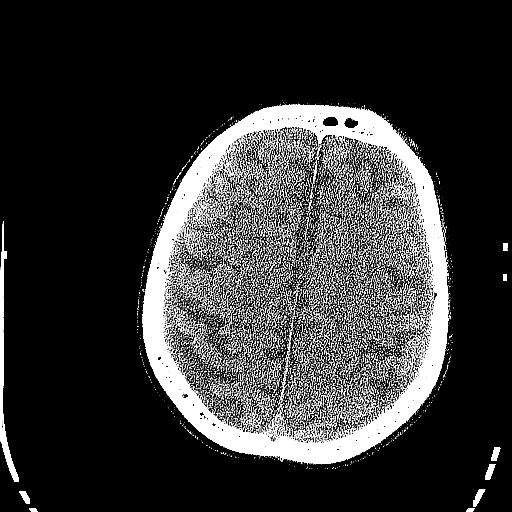
[im 65/86  brain]
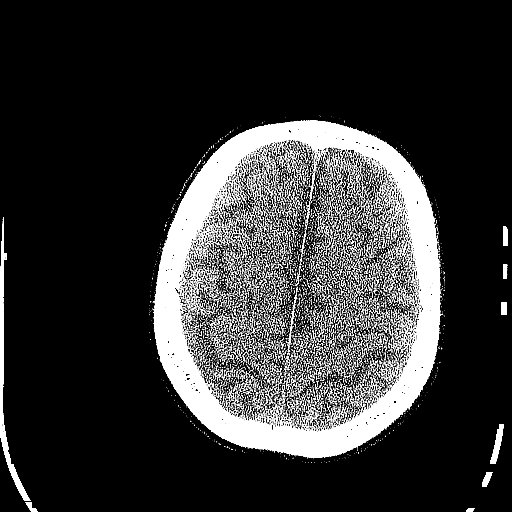
[im 65/86  bone]
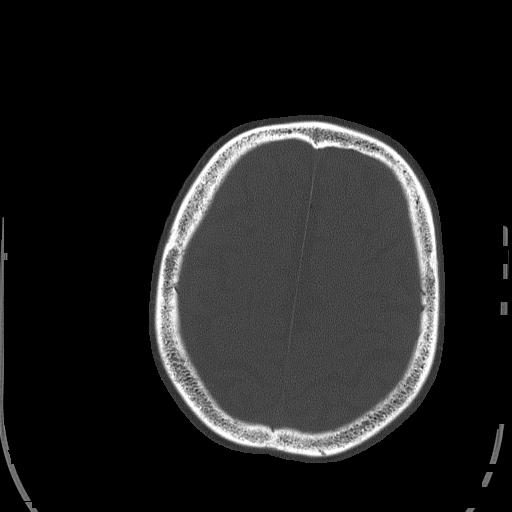
[im 69/86  brain]
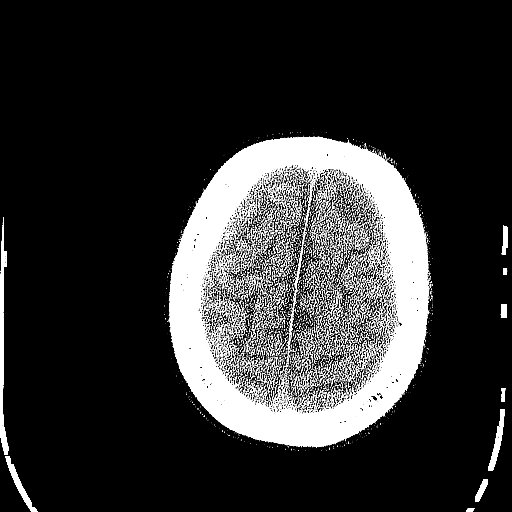
[im 77/86  brain]
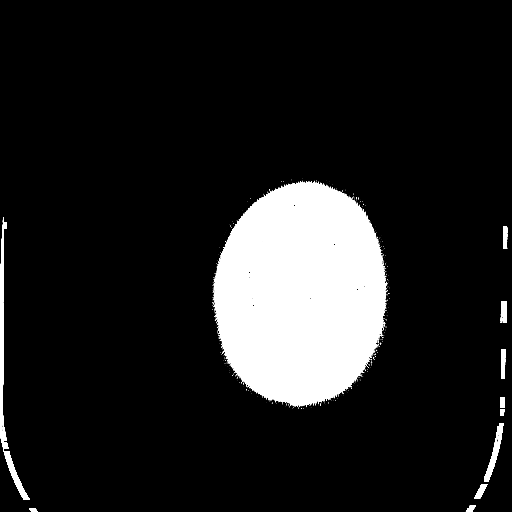
[im 81/86  brain]
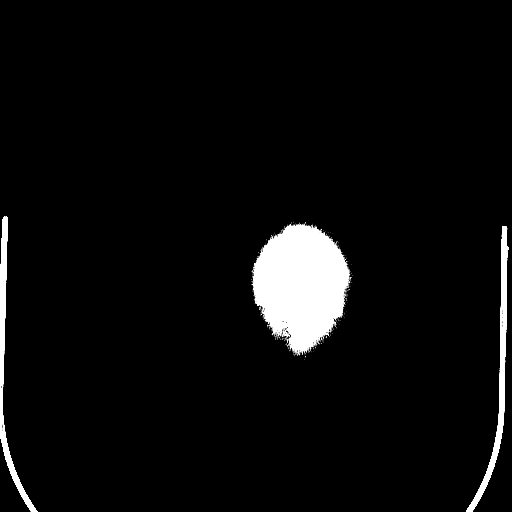

[16 of 30 positions shown; findings below may reference images not displayed]

FINDINGS: There is no acute intracranial hemorrhage, acute infarction, or mass
lesion. There is diffuse cerebral cortical atrophy with secondary
ventricular dilatation consistent with the patient's age. There is
minimal periventricular white matter lucency consistent with slight
chronic small vessel ischemic disease.

There are few opacified right mastoid air cells. This is not felt to
be significant. Osseous structures are otherwise normal.
IMPRESSION: No acute intracranial abnormality. Age related atrophy. Minimal
chronic periventricular small vessel disease.
